# Patient Record
Sex: Female | Born: 1985
Health system: Southern US, Community
[De-identification: ages and names within clinical notes are randomized; demographics above are authoritative.]

## PROBLEM LIST (undated history)

## (undated) DIAGNOSIS — L309 Dermatitis, unspecified: Secondary | ICD-10-CM

## (undated) DIAGNOSIS — Z3490 Encounter for supervision of normal pregnancy, unspecified, unspecified trimester: Secondary | ICD-10-CM

## (undated) HISTORY — DX: Dermatitis, unspecified: L30.9

## (undated) HISTORY — PX: ADENOIDECTOMY: SUR15

## (undated) HISTORY — PX: TONSILLECTOMY: SUR1361

---

## 2005-01-31 ENCOUNTER — Other Ambulatory Visit: Admission: RE | Admit: 2005-01-31 | Discharge: 2005-01-31 | Payer: Self-pay | Admitting: Family Medicine

## 2005-03-12 ENCOUNTER — Emergency Department (HOSPITAL_COMMUNITY): Admission: EM | Admit: 2005-03-12 | Discharge: 2005-03-12 | Payer: Self-pay | Admitting: *Deleted

## 2007-01-09 ENCOUNTER — Emergency Department (HOSPITAL_COMMUNITY): Admission: EM | Admit: 2007-01-09 | Discharge: 2007-01-09 | Payer: Self-pay | Admitting: Emergency Medicine

## 2007-09-17 ENCOUNTER — Emergency Department (HOSPITAL_COMMUNITY): Admission: EM | Admit: 2007-09-17 | Discharge: 2007-09-17 | Payer: Self-pay | Admitting: Emergency Medicine

## 2008-12-07 ENCOUNTER — Emergency Department (HOSPITAL_COMMUNITY): Admission: EM | Admit: 2008-12-07 | Discharge: 2008-12-07 | Payer: Self-pay | Admitting: Emergency Medicine

## 2010-01-08 ENCOUNTER — Emergency Department (HOSPITAL_COMMUNITY): Admission: EM | Admit: 2010-01-08 | Discharge: 2010-01-09 | Payer: Self-pay | Admitting: Emergency Medicine

## 2010-10-31 LAB — BASIC METABOLIC PANEL
BUN: 11 mg/dL (ref 6–23)
CO2: 25 mEq/L (ref 19–32)
Chloride: 106 mEq/L (ref 96–112)
GFR calc non Af Amer: >60 mL/min (ref >60)
Potassium: 3.7 mEq/L (ref 3.5–5.1)

## 2010-10-31 LAB — DIFFERENTIAL
Basophils Absolute: 0 10*3/uL (ref 0.0–0.1)
Eosinophils Absolute: 0.2 10*3/uL (ref 0.0–0.7)
Eosinophils Relative: 2 % (ref 0–5)
Lymphocytes Relative: 35 % (ref 12–46)
Monocytes Absolute: 0.8 10*3/uL (ref 0.1–1.0)
Monocytes Relative: 11 % (ref 3–12)
Neutrophils Relative %: 51 % (ref 43–77)

## 2010-10-31 LAB — CBC
Hemoglobin: 13.4 g/dL (ref 12.0–15.0)
Platelets: 291 10*3/uL (ref 150–400)
WBC: 7.2 10*3/uL (ref 4.0–10.5)

## 2011-05-05 LAB — PREGNANCY, URINE: Preg Test, Ur: POSITIVE

## 2012-05-18 ENCOUNTER — Encounter (HOSPITAL_COMMUNITY): Payer: Self-pay

## 2012-05-18 ENCOUNTER — Emergency Department (HOSPITAL_COMMUNITY)
Admission: EM | Admit: 2012-05-18 | Discharge: 2012-05-18 | Disposition: A | Discharge status: Home / Self Care | Payer: BC Managed Care – PPO | Attending: Emergency Medicine | Admitting: Emergency Medicine

## 2012-05-18 DIAGNOSIS — Z3490 Encounter for supervision of normal pregnancy, unspecified, unspecified trimester: Secondary | ICD-10-CM

## 2012-05-18 DIAGNOSIS — R32 Unspecified urinary incontinence: Secondary | ICD-10-CM

## 2012-05-18 DIAGNOSIS — O210 Mild hyperemesis gravidarum: Secondary | ICD-10-CM | POA: Insufficient documentation

## 2012-05-18 DIAGNOSIS — Z9089 Acquired absence of other organs: Secondary | ICD-10-CM | POA: Insufficient documentation

## 2012-05-18 DIAGNOSIS — R1110 Vomiting, unspecified: Secondary | ICD-10-CM

## 2012-05-18 HISTORY — DX: Encounter for supervision of normal pregnancy, unspecified, unspecified trimester: Z34.90

## 2012-05-18 LAB — CBC WITH DIFFERENTIAL/PLATELET
Basophils Absolute: 0 10*3/uL (ref 0.0–0.1)
Eosinophils Absolute: 0.2 10*3/uL (ref 0.0–0.7)
Hemoglobin: 11.5 g/dL — ABNORMAL LOW (ref 12.0–15.0)
Lymphocytes Relative: 18 % (ref 12–46)
MCH: 27.2 pg (ref 26.0–34.0)
MCV: 79.4 fL (ref 78.0–100.0)
WBC: 8.4 10*3/uL (ref 4.0–10.5)

## 2012-05-18 LAB — COMPREHENSIVE METABOLIC PANEL
AST: 13 U/L (ref 0–37)
Albumin: 3.2 g/dL — ABNORMAL LOW (ref 3.5–5.2)
CO2: 22 mEq/L (ref 19–32)
Glucose, Bld: 88 mg/dL (ref 70–99)
Potassium: 3.2 mEq/L — ABNORMAL LOW (ref 3.5–5.1)
Sodium: 135 mEq/L (ref 135–145)

## 2012-05-18 LAB — URINALYSIS, ROUTINE W REFLEX MICROSCOPIC
Bilirubin Urine: NEGATIVE
Hgb urine dipstick: NEGATIVE
Ketones, ur: NEGATIVE mg/dL
Nitrite: NEGATIVE
Protein, ur: NEGATIVE mg/dL
Urobilinogen, UA: 1 mg/dL (ref 0.0–1.0)

## 2012-05-18 LAB — WET PREP, GENITAL

## 2012-05-18 MED ORDER — ONDANSETRON 8 MG PO TBDP: ORAL_TABLET | ORAL | Status: DC

## 2012-05-18 MED ORDER — SODIUM CHLORIDE 0.9 % IV BOLUS (SEPSIS)
1000.0000 mL | Freq: Once | INTRAVENOUS | Status: AC
  Administered 2012-05-18: 1000 mL via INTRAVENOUS

## 2012-05-18 MED ORDER — GUAIFENESIN-CODEINE 100-10 MG/5ML PO SYRP: 5.0000 mL | ORAL_SOLUTION | Freq: Three times a day (TID) | ORAL | Status: DC | PRN

## 2012-05-18 MED ORDER — ONDANSETRON HCL 4 MG/2ML IJ SOLN
4.0000 mg | Freq: Once | INTRAMUSCULAR | Status: AC
  Administered 2012-05-18: 4 mg via INTRAVENOUS
  Filled 2012-05-18: qty 2

## 2012-05-18 NOTE — ED Provider Notes (Signed)
History   This chart was scribed for Geoffery Lyons, MD scribed by Magnus Sinning. The patient was seen in room [facility]01/[facility]01 at 18:59   CSN: [id number]  Arrival date & time 05/18/12  1827   First MD Initiated Contact with Patient 05/18/12 1854      Chief Complaint  Patient presents with  . Emesis  . Weakness    (Consider location/radiation/quality/duration/timing/severity/associated sxs/prior treatment) HPI Shannon Moon is a 26 y.o.[redacted] weeks pregnant female who presents to the Emergency Department complaining of constant moderate emesis, onset two weeks with associated bladder incontinence, weakness cough and congestion. Patient states that she has been unable to keep anything she drinks or eats down. She says whenever she coughs or vomits that she would also urinate on herself. States she has taken otc decongestant with minimal relief, and denies contractions, vaginal bleeding , vaginal discharge, or diarrhea. This is the patient's second pregnancy and she reports child was seen recently for treatment of PNA. Otherwise does not provide any other sick contact exposure.  Past Medical History  Diagnosis Date  . Pregnancy     Past Surgical History  Procedure Date  . Cesarean section   . Tonsillectomy   . Adenoidectomy     No family history on file.  History  Substance Use Topics  . Smoking status: Former Games developer  . Smokeless tobacco: Not on file  . Alcohol Use: No    OB History    Grav Para Term Preterm Abortions TAB SAB Ect Mult Living   1               Review of Systems 10 Systems reviewed and are negative for acute change except as noted in the HPI. Allergies  Review of patient's allergies indicates no known allergies.  Home Medications  No current outpatient prescriptions on file.  BP 124/79  Pulse 119  Temp 98.7 F (37.1 C) (Oral)  Resp 20  Ht 5\' 4"  (1.626 m)  Wt 152 lb (68.947 kg)  BMI 26.09 kg/m2  SpO2 100%  LMP 12/02/2011  Physical Exam  Nursing  note and vitals reviewed. Constitutional: She is oriented to person, place, and time. She appears well-developed and well-nourished. No distress.  HENT:  Head: Normocephalic and atraumatic.  Eyes: Conjunctivae normal and EOM are normal.  Neck: Neck supple. No tracheal deviation present.  Cardiovascular: Normal rate.   Pulmonary/Chest: Effort normal. No respiratory distress.  Abdominal: She exhibits no distension. There is no tenderness. There is no rebound and no guarding.       Gravid uterus with fundal height consistent with gestational age  Musculoskeletal: Normal range of motion.  Neurological: She is alert and oriented to person, place, and time. No sensory deficit.  Skin: Skin is dry.  Psychiatric: She has a normal mood and affect. Her behavior is normal.    ED Course  Procedures (including critical care time) DIAGNOSTIC STUDIES: Oxygen Saturation is 100% on room air, normal by my interpretation.    COORDINATION OF CARE: 19:00: Physical exam performed  Labs Reviewed  CBC WITH DIFFERENTIAL - Abnormal; Notable for the following:    Hemoglobin 11.5 (*)     HCT 33.6 (*)     All other components within normal limits  COMPREHENSIVE METABOLIC PANEL - Abnormal; Notable for the following:    Potassium 3.2 (*)     Albumin 3.2 (*)     Total Bilirubin 0.2 (*)     All other components within normal limits  URINALYSIS, ROUTINE W  REFLEX MICROSCOPIC   No results found.   No diagnosis found.    MDM  The patient presents here with vomiting and nausea for the past two days.  When she vomits, she says she leaks urine.  She denies contractions or pain.  There is no vaginal discharge or spotting.  On exam, the abdomen is gravid consistent with stated gestational age.  The pelvic exam reveals a closed os with pH of 4.5.  This does not appear to be a amniotic fluid leak.  She was hydrated and given zofran and is feeling better.  She will be discharged to home, to return prn for any  problems.     I personally performed the services described in this documentation, which was scribed in my presence. The recorded information has been reviewed and considered.           Geoffery Lyons, MD 05/18/12 2037

## 2012-05-18 NOTE — ED Notes (Signed)
Pt reports being [redacted] weeks pregnant, due date Sep 05, 2012, 2nd pregnancy, no miscarriage or abortion.  Has been leaking urine today when coughing or vomiting. Denies any back pain, denies fever.

## 2012-05-18 NOTE — ED Notes (Signed)
Pt reports that she has been sick for 2 weeks, w/ cough and congestion, started vomiting today and unable to keep foods/liquids down. She is [redacted] weeks pregnant and is feeling weak.

## 2012-05-18 NOTE — ED Notes (Signed)
Pt discharged. Pt stable at time of discharge. Medications reviewed pt has no questions regarding discharge at this time. Pt voiced understanding of discharge instructions.  

## 2012-05-20 LAB — GC/CHLAMYDIA PROBE AMP, GENITAL
Chlamydia, DNA Probe: NEGATIVE
GC Probe Amp, Genital: NEGATIVE

## 2012-12-02 ENCOUNTER — Encounter: Payer: Self-pay | Admitting: Family Medicine

## 2012-12-02 ENCOUNTER — Ambulatory Visit (INDEPENDENT_AMBULATORY_CARE_PROVIDER_SITE_OTHER): Payer: BC Managed Care – PPO | Admitting: Family Medicine

## 2012-12-02 ENCOUNTER — Telehealth: Payer: Self-pay | Admitting: Nurse Practitioner

## 2012-12-02 ENCOUNTER — Encounter: Payer: Self-pay | Admitting: *Deleted

## 2012-12-02 VITALS — BP 121/71 | HR 90 | Temp 98.1°F | Wt 144.6 lb

## 2012-12-02 DIAGNOSIS — I998 Other disorder of circulatory system: Secondary | ICD-10-CM

## 2012-12-02 DIAGNOSIS — R58 Hemorrhage, not elsewhere classified: Secondary | ICD-10-CM

## 2012-12-02 DIAGNOSIS — R21 Rash and other nonspecific skin eruption: Secondary | ICD-10-CM

## 2012-12-02 LAB — POCT CBC
Granulocyte percent: 50.5 %G (ref 37–80)
HCT, POC: 38.5 % (ref 37.7–47.9)
Hemoglobin: 13.1 g/dL (ref 12.2–16.2)
Lymph, poc: 2.7 (ref 0.6–3.4)
MCH, POC: 25.8 pg — AB (ref 27–31.2)
MCHC: 34 g/dL (ref 31.8–35.4)
MCV: 76 fL — AB (ref 80–97)
MPV: 7.2 fL (ref 0–99.8)
POC Granulocyte: 3.2 (ref 2–6.9)
POC LYMPH PERCENT: 42 %L (ref 10–50)
Platelet Count, POC: 305 10*3/uL (ref 142–424)
RBC: 5.1 M/uL (ref 4.04–5.48)
RDW, POC: 14.3 %
WBC: 6.4 10*3/uL (ref 4.6–10.2)

## 2012-12-02 MED ORDER — PREDNISONE 20 MG PO TABS: 40.0000 mg | ORAL_TABLET | Freq: Every day | ORAL | Status: DC

## 2012-12-02 MED ORDER — CETIRIZINE HCL 10 MG PO TABS: 10.0000 mg | ORAL_TABLET | Freq: Every day | ORAL | Status: DC

## 2012-12-02 MED ORDER — TRIAMCINOLONE ACETONIDE 0.1 % EX CREA: TOPICAL_CREAM | Freq: Two times a day (BID) | CUTANEOUS | Status: DC

## 2012-12-02 NOTE — Progress Notes (Signed)
Patient ID: Shannon Moon, female   DOB: [birth year], 27 y.o.   MRN: [id number] SUBJECTIVE: HPI: Rash appeared 2 weeks and itching badly Rash Patient presents for evaluation of a rash involving the chest, elbow, forearm and upper body. Rash started 2 weeks ago. Lesions are skin colored, and raised in texture. Rash has not changed over time. Rash is pruritic. Associated symptoms: none. Patient denies: abdominal pain, arthralgia, congestion, cough, decrease in appetite, fever, irritability, myalgia, sore throat and vomiting. Patient has not had contacts with similar rash. Patient has not had new exposures (soaps, lotions, laundry detergents, foods, medications, plants, insects or animals).   PMH/PSH: reviewed/updated in Epic  SH/FH: reviewed/updated in Epic  Allergies: reviewed/updated in Epic  Medications: reviewed/updated in Epic  Immunizations: reviewed/updated in Epic  ROS: As above in the HPI. All other systems are stable or negative.  OBJECTIVE: APPEARANCE:  Patient in no acute distress.The patient appeared well nourished and normally developed. Acyanotic. Waist: VITAL SIGNS:BP 121/71  Pulse 90  Temp(Src) 98.1 F (36.7 C) (Oral)  Wt 144 lb 9.6 oz (65.59 kg)  BMI 24.81 kg/m2  LMP 11/01/2011  Breastfeeding? Unknown Not breast feeding her 83 month old.  SKIN: warm and  Dry without tattoos and scars. There is a widespread fine papular rash on the trunk and forearms and arms. Very pruritic. Has eccymotic bruises, from scratching.  HEAD and Neck: without JVD, Head and scalp: normal Eyes:No scleral icterus. Fundi normal, eye movements normal. Ears: Auricle normal, canal normal, Tympanic membranes normal, insufflation normal. Nose: normal Throat: normal Neck & thyroid: normal  CHEST & LUNGS: Chest wall: normal Lungs: Clear  CVS: Reveals the PMI to be normally located. Regular rhythm, First and Second Heart sounds are normal,  absence of murmurs, rubs or  gallops. Peripheral vasculature: Radial pulses: normal Dorsal pedis pulses: normal Posterior pulses: normal  ABDOMEN:  Appearance: normal Benign,, no organomegaly, no masses, no Abdominal Aortic enlargement. No Guarding , no rebound. No Bruits. Bowel sounds: normal  RECTAL: N/A GU: N/A  EXTREMETIES: nonedematous.  MUSCULOSKELETAL:  Spine: normal Joints: intact  NEUROLOGIC: oriented to time,place and person; nonfocal. Strength is normal  ASSESSMENT: Rash and nonspecific skin eruption - Plan: POCT CBC  Ecchymosis will check a CBC to ensure this is not due to ITP.  PLAN: Orders Placed This Encounter  Procedures  . POCT CBC   Results for orders placed in visit on 12/02/12 (from the past 24 hour(s))  POCT CBC     Status: Abnormal   Collection Time    12/02/12  6:32 PM      Result Value Range   WBC 6.4  4.6 - 10.2 K/uL   Lymph, poc 2.7  0.6 - 3.4   POC LYMPH PERCENT 42.0  10 - 50 %L   POC Granulocyte 3.2  2 - 6.9   Granulocyte percent 50.5  37 - 80 %G   RBC 5.1  4.04 - 5.48 M/uL   Hemoglobin 13.1  12.2 - 16.2 g/dL   HCT, POC 40.9  81.1 - 47.9 %   MCV 76.0 (*) 80 - 97 fL   MCH, POC 25.8 (*) 27 - 31.2 pg   MCHC 34.0  31.8 - 35.4 g/dL   RDW, POC 91.4     Platelet Count, POC 305.0  142 - 424 K/uL   MPV 7.2  0 - 99.8 fL   Meds ordered this encounter  Medications  . predniSONE (DELTASONE) 20 MG tablet    Sig: Take 2 tablets (  40 mg total) by mouth daily. For 2 days then 1 tablet daily for 2 days then 1/2 tab daily for 2 days.    Dispense:  7 tablet    Refill:  0  . cetirizine (ZYRTEC) 10 MG tablet    Sig: Take 1 tablet (10 mg total) by mouth daily.    Dispense:  30 tablet    Refill:  5  . triamcinolone cream (KENALOG) 0.1 %    Sig: Apply topically 2 (two) times daily. For 10 days.    Dispense:  30 g    Refill:  0  discussed results, and skin care. RTC prn.  Shamari Lofquist P. Modesto Charon, M.D.

## 2012-12-02 NOTE — Telephone Encounter (Signed)
Patient c/o diffuse pruritic rash and bruising x 2 weeks.  She has taken Benadryl with no relief.  Appt scheduled in [facility] for this evening.

## 2012-12-24 ENCOUNTER — Telehealth: Payer: Self-pay | Admitting: Nurse Practitioner

## 2012-12-24 NOTE — Telephone Encounter (Signed)
OFFERED APPT FOR TODAY. PT DECIDED TO WAIT AND CALL BACK LATER IF WANTED TO BE SEEN.

## 2013-05-29 ENCOUNTER — Ambulatory Visit: Payer: BC Managed Care – PPO | Admitting: Family Medicine

## 2014-06-15 ENCOUNTER — Encounter: Payer: Self-pay | Admitting: Family Medicine

## 2014-09-18 ENCOUNTER — Encounter: Payer: Self-pay | Admitting: Family Medicine

## 2014-09-18 ENCOUNTER — Ambulatory Visit (INDEPENDENT_AMBULATORY_CARE_PROVIDER_SITE_OTHER): Payer: BLUE CROSS/BLUE SHIELD | Admitting: Family Medicine

## 2014-09-18 VITALS — BP 131/85 | HR 89 | Temp 98.6°F | Ht 64.0 in | Wt 172.2 lb

## 2014-09-18 DIAGNOSIS — G43001 Migraine without aura, not intractable, with status migrainosus: Secondary | ICD-10-CM

## 2014-09-18 DIAGNOSIS — J069 Acute upper respiratory infection, unspecified: Secondary | ICD-10-CM

## 2014-09-18 MED ORDER — AZITHROMYCIN 250 MG PO TABS: ORAL_TABLET | ORAL | Status: DC

## 2014-09-18 MED ORDER — METHYLPREDNISOLONE (PAK) 4 MG PO TABS: ORAL_TABLET | ORAL | Status: DC

## 2014-09-18 MED ORDER — SUMATRIPTAN SUCCINATE 100 MG PO TABS: 100.0000 mg | ORAL_TABLET | ORAL | Status: DC | PRN

## 2014-09-18 MED ORDER — BUTALBITAL-APAP-CAFFEINE 50-325-40 MG PO TABS: 1.0000 | ORAL_TABLET | Freq: Four times a day (QID) | ORAL | Status: DC | PRN

## 2014-09-18 NOTE — Progress Notes (Signed)
   Subjective:    Patient ID: Shannon Moon, female    DOB: 10/04/1985, 29 y.o.   MRN: [id number]  HPI Patient is c/o headache with migraine features.  She denies aura and has one sided pulsatile headache with phonophotophobia.  She is having some URI sx's.  Review of Systems  Constitutional: Negative for fever.  HENT: Negative for ear pain.   Eyes: Negative for discharge.  Respiratory: Negative for cough.   Cardiovascular: Negative for chest pain.  Gastrointestinal: Negative for abdominal distention.  Endocrine: Negative for polyuria.  Genitourinary: Negative for difficulty urinating.  Musculoskeletal: Negative for gait problem and neck pain.  Skin: Negative for color change and rash.  Neurological: Negative for speech difficulty and headaches.  Psychiatric/Behavioral: Negative for agitation.       Objective:    BP 131/85 mmHg  Pulse 89  Temp(Src) 98.6 F (37 C) (Oral)  Ht 5\' 4"  (1.626 m)  Wt 172 lb 3.2 oz (78.109 kg)  BMI 29.54 kg/m2 Physical Exam  Constitutional: She is oriented to person, place, and time. She appears well-developed and well-nourished.  HENT:  Head: Normocephalic and atraumatic.  Mouth/Throat: Oropharynx is clear and moist.  Eyes: Pupils are equal, round, and reactive to light.  Neck: Normal range of motion. Neck supple.  Cardiovascular: Normal rate and regular rhythm.   No murmur heard. Pulmonary/Chest: Effort normal and breath sounds normal.  Abdominal: Soft. Bowel sounds are normal. There is no tenderness.  Neurological: She is alert and oriented to person, place, and time.  Skin: Skin is warm and dry.  Psychiatric: She has a normal mood and affect.          Assessment & Plan:     ICD-9-CM ICD-10-CM   1. Migraine without aura and with status migrainosus, not intractable 346.12 G43.001 methylPREDNIsolone (MEDROL DOSPACK) 4 MG tablet     SUMAtriptan (IMITREX) 100 MG tablet     butalbital-acetaminophen-caffeine (FIORICET) 50-325-40 MG per  tablet  2. URI (upper respiratory infection) 465.9 J06.9 azithromycin (ZITHROMAX) 250 MG tablet     No Follow-up on file.  Deatra CanterWilliam J Bj Morlock FNP

## 2014-11-27 ENCOUNTER — Ambulatory Visit (INDEPENDENT_AMBULATORY_CARE_PROVIDER_SITE_OTHER): Payer: BLUE CROSS/BLUE SHIELD

## 2014-11-27 ENCOUNTER — Ambulatory Visit (INDEPENDENT_AMBULATORY_CARE_PROVIDER_SITE_OTHER): Payer: BLUE CROSS/BLUE SHIELD | Admitting: Family Medicine

## 2014-11-27 ENCOUNTER — Other Ambulatory Visit: Payer: Self-pay | Admitting: Family Medicine

## 2014-11-27 ENCOUNTER — Encounter: Payer: Self-pay | Admitting: Family Medicine

## 2014-11-27 VITALS — BP 132/87 | HR 92 | Temp 99.1°F | Ht 64.0 in | Wt 176.0 lb

## 2014-11-27 DIAGNOSIS — M25571 Pain in right ankle and joints of right foot: Secondary | ICD-10-CM | POA: Diagnosis not present

## 2014-11-27 DIAGNOSIS — S93601A Unspecified sprain of right foot, initial encounter: Secondary | ICD-10-CM | POA: Diagnosis not present

## 2014-11-27 MED ORDER — DICLOFENAC SODIUM 75 MG PO TBEC: 75.0000 mg | DELAYED_RELEASE_TABLET | Freq: Two times a day (BID) | ORAL | Status: DC

## 2014-11-27 NOTE — Progress Notes (Addendum)
Subjective:  Patient ID: Shannon Moon, female    DOB: 17-Jul-1986  Age: 29 y.o. MRN: [id number]  CC: Ankle Pain   HPI Shannon Moon presents for walking down steps this morning. She stepped off and missed the bottom step, stumbled and inverted her right foot. Now has pain and swelling at the lateral aspect of the midfoot.  History Shannon Moon has a past medical history of Pregnancy.   She has past surgical history that includes Cesarean section; Tonsillectomy; and Adenoidectomy.   Her family history is not on file.She reports that she has never smoked. She does not have any smokeless tobacco history on file. She reports that she does not drink alcohol or use illicit drugs.  Current Outpatient Prescriptions on File Prior to Visit  Medication Sig Dispense Refill  . butalbital-acetaminophen-caffeine (FIORICET) 50-325-40 MG per tablet Take 1-2 tablets by mouth every 6 (six) hours as needed for headache. 20 tablet 3  . cetirizine (ZYRTEC) 10 MG tablet Take 1 tablet (10 mg total) by mouth daily. 30 tablet 5  . SUMAtriptan (IMITREX) 100 MG tablet Take 1 tablet (100 mg total) by mouth every 2 (two) hours as needed for migraine or headache. May repeat in 2 hours if headache persists or recurs. 10 tablet 3   No current facility-administered medications on file prior to visit.    ROS Review of Systems  Constitutional: Negative for fever, chills, diaphoresis, appetite change, fatigue and unexpected weight change.  HENT: Negative for congestion, rhinorrhea and sneezing.   Eyes: Negative for pain.  Respiratory: Negative for cough, chest tightness and shortness of breath.   Cardiovascular: Negative for chest pain and palpitations.  Gastrointestinal: Negative for vomiting.  Musculoskeletal: Positive for joint swelling and arthralgias.       See history of present illness  Neurological: Negative for dizziness, weakness and numbness.    Objective:  BP 132/87 mmHg  Pulse 92  Temp(Src) 99.1 F  (37.3 C)  Ht  (1.626 m)  Wt 176 lb (79.833 kg)  BMI 30.20 kg/m2  Physical Exam  Constitutional: She is oriented to person, place, and time. She appears well-developed and well-nourished. No distress.  HENT:  Head: Normocephalic and atraumatic.  Right Ear: External ear normal.  Left Ear: External ear normal.  Nose: Nose normal.  Mouth/Throat: Oropharynx is clear and moist.  Eyes: Conjunctivae and EOM are normal. Pupils are equal, round, and reactive to light.  Neck: Normal range of motion. Neck supple. No thyromegaly present.  Cardiovascular: Normal rate, regular rhythm and normal heart sounds.   No murmur heard. Pulmonary/Chest: Effort normal and breath sounds normal. No respiratory distress. She has no wheezes. She has no rales.  Abdominal: Soft. Bowel sounds are normal. She exhibits no distension. There is no tenderness.  Musculoskeletal: She exhibits tenderness (at lateral right mid foot. Moderate. Stable for porcine and varus valgus motion).  Lymphadenopathy:    She has no cervical adenopathy.  Neurological: She is alert and oriented to person, place, and time. She has normal reflexes.  Skin: Skin is warm and dry.  Psychiatric: She has a normal mood and affect. Her behavior is normal. Judgment and thought content normal.    Assessment & Plan:   Shannon Moon was seen today for ankle pain.  Diagnoses and all orders for this visit:  Pain in joint, ankle and foot, right Orders: -     Cancel: DG Ankle Complete Right; Future  Foot sprain, right, initial encounter  Other orders -  diclofenac (VOLTAREN) 75 MG EC tablet; Take 1 tablet (75 mg total) by mouth 2 (two) times daily.   I have discontinued Shannon Moon's azithromycin and methylPREDNIsolone. I am also having her start on diclofenac. Additionally, I am having her maintain her cetirizine, SUMAtriptan, and butalbital-acetaminophen-caffeine.  Meds ordered this encounter  Medications  . diclofenac (VOLTAREN) 75 MG EC  tablet    Sig: Take 1 tablet (75 mg total) by mouth 2 (two) times daily.    Dispense:  60 tablet    Refill:  2   X-ray of the right foot shows no evidence of fracture. There is some soft tissue swelling at the lateral midfoot.  Follow-up: Return in about 2 weeks (around 12/11/2014).  Mechele ClaudeWarren Lavene Penagos, M.D.

## 2014-12-08 ENCOUNTER — Telehealth: Payer: Self-pay

## 2014-12-08 MED ORDER — MELOXICAM 15 MG PO TABS: 15.0000 mg | ORAL_TABLET | Freq: Every day | ORAL | Status: DC

## 2014-12-08 NOTE — Telephone Encounter (Signed)
Insurance denied prior authorization for Diclofenac Sodium  Only approved for osteoarthritis

## 2014-12-08 NOTE — Telephone Encounter (Signed)
Meloxicam ordered

## 2014-12-08 NOTE — Telephone Encounter (Signed)
I'm not sure what to order since the chart says she has no coverage for visit. Diclofenac is one of the cheapest things available if she is going to pay cash. Is it possible she is on Medicaid? If so is there a way to find out the options that they do cover? Thanks, WS.

## 2014-12-09 NOTE — Telephone Encounter (Signed)
Detailed message left for patient.

## 2015-05-24 ENCOUNTER — Ambulatory Visit (INDEPENDENT_AMBULATORY_CARE_PROVIDER_SITE_OTHER): Payer: BLUE CROSS/BLUE SHIELD | Admitting: Family Medicine

## 2015-05-24 VITALS — BP 135/81 | HR 99 | Temp 98.3°F | Ht 64.0 in | Wt 178.6 lb

## 2015-05-24 DIAGNOSIS — J329 Chronic sinusitis, unspecified: Secondary | ICD-10-CM | POA: Diagnosis not present

## 2015-05-24 DIAGNOSIS — J40 Bronchitis, not specified as acute or chronic: Secondary | ICD-10-CM | POA: Diagnosis not present

## 2015-05-24 MED ORDER — PSEUDOEPHEDRINE-GUAIFENESIN ER 120-1200 MG PO TB12: 1.0000 | ORAL_TABLET | Freq: Two times a day (BID) | ORAL | Status: DC

## 2015-05-24 MED ORDER — LEVOFLOXACIN 500 MG PO TABS: 500.0000 mg | ORAL_TABLET | Freq: Every day | ORAL | Status: DC

## 2015-05-24 MED ORDER — BENZONATATE 200 MG PO CAPS: 200.0000 mg | ORAL_CAPSULE | Freq: Three times a day (TID) | ORAL | Status: DC | PRN

## 2015-05-24 NOTE — Progress Notes (Signed)
Subjective:  Patient ID: Shannon Moon, female    DOB: 12-May-1986  Age: 29 y.o. MRN: [id number]  CC: Cough   HPI  [name] presents for respiratory congestion and cough productive of yellow sputum. Denies fever, dyspnea. Chest feels tight centrally.    History Shannon Moon has a past medical history of Pregnancy.   She has past surgical history that includes Cesarean section; Tonsillectomy; and Adenoidectomy.   Her family history is not on file.She reports that she has never smoked. She does not have any smokeless tobacco history on file. She reports that she does not drink alcohol or use illicit drugs.  Outpatient Prescriptions Prior to Visit  Medication Sig Dispense Refill  . butalbital-acetaminophen-caffeine (FIORICET) 50-325-40 MG per tablet Take 1-2 tablets by mouth every 6 (six) hours as needed for headache. (Patient not taking: Reported on 05/24/2015) 20 tablet 3  . cetirizine (ZYRTEC) 10 MG tablet Take 1 tablet (10 mg total) by mouth daily. (Patient not taking: Reported on 05/24/2015) 30 tablet 5  . meloxicam (MOBIC) 15 MG tablet Take 1 tablet (15 mg total) by mouth daily. (Patient not taking: Reported on 05/24/2015) 30 tablet 2  . SUMAtriptan (IMITREX) 100 MG tablet Take 1 tablet (100 mg total) by mouth every 2 (two) hours as needed for migraine or headache. May repeat in 2 hours if headache persists or recurs. (Patient not taking: Reported on 05/24/2015) 10 tablet 3   No facility-administered medications prior to visit.    ROS Review of Systems  Constitutional: Negative for fever, chills, activity change and appetite change.  HENT: Positive for congestion, postnasal drip, rhinorrhea and sinus pressure. Negative for ear discharge, ear pain, hearing loss, nosebleeds, sneezing and trouble swallowing.   Respiratory: Positive for cough. Negative for chest tightness and shortness of breath.   Cardiovascular: Negative for chest pain and palpitations.  Skin: Negative for rash.     Objective:  BP 135/81 mmHg  Pulse 99  Temp(Src) 98.3 F (36.8 C) (Oral)  Ht  (1.626 m)  Wt 178 lb 9.6 oz (81.012 kg)  BMI 30.64 kg/m2  BP Readings from Last 3 Encounters:  05/24/15 135/81  11/27/14 132/87  09/18/14 131/85    Wt Readings from Last 3 Encounters:  05/24/15 178 lb 9.6 oz (81.012 kg)  11/27/14 176 lb (79.833 kg)  09/18/14 172 lb 3.2 oz (78.109 kg)     Physical Exam  Constitutional: She is oriented to person, place, and time. She appears well-developed and well-nourished. No distress.  HENT:  Head: Normocephalic and atraumatic.  Right Ear: External ear normal.  Left Ear: External ear normal.  Nose: Mucosal edema, rhinorrhea and sinus tenderness present. No epistaxis. Right sinus exhibits maxillary sinus tenderness. Left sinus exhibits maxillary sinus tenderness.  Mouth/Throat: Oropharynx is clear and moist.  Eyes: Conjunctivae and EOM are normal. Pupils are equal, round, and reactive to light.  Neck: Normal range of motion. Neck supple.  Cardiovascular: Normal rate, regular rhythm and normal heart sounds.   No murmur heard. Pulmonary/Chest: Effort normal. No respiratory distress. She has wheezes (mild bronchovesicular changes). She has no rales.  Lymphadenopathy:    She has no cervical adenopathy.  Neurological: She is alert and oriented to person, place, and time. She has normal reflexes.  Skin: Skin is warm and dry.  Psychiatric: She has a normal mood and affect. Her behavior is normal.    No results found for: HGBA1C  Lab Results  Component Value Date   WBC 6.4 12/02/2012  HGB 13.1 12/02/2012   HCT 38.5 12/02/2012   PLT 311 05/18/2012   GLUCOSE 88 05/18/2012   ALT 7 05/18/2012   AST 13 05/18/2012   NA 135 05/18/2012   K 3.2* 05/18/2012   CL 103 05/18/2012   CREATININE 0.50 05/18/2012   BUN 7 05/18/2012   CO2 22 05/18/2012    No results found.  Assessment & Plan:   Shannon Moon was seen today for cough.  Diagnoses and all orders  for this visit:  Sinobronchitis  Other orders -     levofloxacin (LEVAQUIN) 500 MG tablet; Take 1 tablet (500 mg total) by mouth daily. -     benzonatate (TESSALON) 200 MG capsule; Take 1 capsule (200 mg total) by mouth 3 (three) times daily as needed for cough. -     Pseudoephedrine-Guaifenesin [phone] MG TB12; Take 1 tablet by mouth 2 (two) times daily. For congeestion   I have discontinued Ms. Vernon's cetirizine, SUMAtriptan, butalbital-acetaminophen-caffeine, and meloxicam. I am also having her start on levofloxacin, benzonatate, and Pseudoephedrine-Guaifenesin.  Meds ordered this encounter  Medications  . levofloxacin (LEVAQUIN) 500 MG tablet    Sig: Take 1 tablet (500 mg total) by mouth daily.    Dispense:  7 tablet    Refill:  0  . benzonatate (TESSALON) 200 MG capsule    Sig: Take 1 capsule (200 mg total) by mouth 3 (three) times daily as needed for cough.    Dispense:  20 capsule    Refill:  0  . Pseudoephedrine-Guaifenesin [phone] MG TB12    Sig: Take 1 tablet by mouth 2 (two) times daily. For congeestion    Dispense:  20 each    Refill:  0     Follow-up: No Follow-up on file.  Shannon Moon, M.D.

## 2015-06-24 ENCOUNTER — Encounter: Payer: Self-pay | Admitting: Nurse Practitioner

## 2015-06-24 ENCOUNTER — Ambulatory Visit (INDEPENDENT_AMBULATORY_CARE_PROVIDER_SITE_OTHER): Payer: BLUE CROSS/BLUE SHIELD | Admitting: Nurse Practitioner

## 2015-06-24 VITALS — BP 130/81 | HR 94 | Temp 99.1°F | Ht 62.0 in | Wt 178.0 lb

## 2015-06-24 DIAGNOSIS — J069 Acute upper respiratory infection, unspecified: Secondary | ICD-10-CM | POA: Diagnosis not present

## 2015-06-24 DIAGNOSIS — J0101 Acute recurrent maxillary sinusitis: Secondary | ICD-10-CM | POA: Diagnosis not present

## 2015-06-24 DIAGNOSIS — L500 Allergic urticaria: Secondary | ICD-10-CM

## 2015-06-24 MED ORDER — AMOXICILLIN-POT CLAVULANATE 875-125 MG PO TABS: 1.0000 | ORAL_TABLET | Freq: Two times a day (BID) | ORAL | Status: DC

## 2015-06-24 MED ORDER — METHYLPREDNISOLONE ACETATE 80 MG/ML IJ SUSP
80.0000 mg | Freq: Once | INTRAMUSCULAR | Status: AC
  Administered 2015-06-24: 80 mg via INTRAMUSCULAR

## 2015-06-24 NOTE — Patient Instructions (Signed)

## 2015-06-24 NOTE — Progress Notes (Signed)
  Subjective:     Lavina HammanDevona Y Vernon is a 29 y.o. female who presents for evaluation of sinus pain. Symptoms include: congestion, cough, facial pain, fevers, post nasal drip and sore throat. Onset of symptoms was 5 days ago. Symptoms have been gradually worsening since that time. Past history is significant for no history of pneumonia or bronchitis. Patient is a non-smoker. * broke out in rash yesterday morning-0 think it may be from theraflu she started 2 days ago. The following portions of the patient's history were reviewed and updated as appropriate: allergies, current medications, past family history, past medical history, past social history, past surgical history and problem list.  Review of Systems Pertinent items are noted in HPI.   Objective:    BP 130/81 mmHg  Pulse 94  Temp(Src) 99.1 F (37.3 C) (Oral)  Ht 5\' 2"  (1.575 m)  Wt 178 lb (80.74 kg)  BMI 32.55 kg/m2 General appearance: alert and cooperative Eyes: conjunctivae/corneas clear. PERRL, EOM's intact. Fundi benign. Ears: normal TM's and external ear canals both ears Nose: clear discharge, mild congestion, turbinates pale, no sinus tenderness Throat: lips, mucosa, and tongue normal; teeth and gums normal Neck: no adenopathy, no carotid bruit, no JVD, supple, symmetrical, trachea midline and thyroid not enlarged, symmetric, no tenderness/mass/nodules Lungs: clear to auscultation bilaterally and deep dry cough Heart: regular rate and rhythm, S1, S2 normal, no murmur, click, rub or gallop Skin: macular - scattered, face, neck, abdomen, trunk    Assessment:    Acute bacterial sinusitis with cough.    Plan:   1. Take meds as prescribed 2. Use a cool mist humidifier especially during the winter months and when heat has been humid. 3. Use saline nose sprays frequently 4. Saline irrigations of the nose can be very helpful if done frequently.  * 4X daily for 1 week*  * Use of a nettie pot can be helpful with this. Follow  directions with this* 5. Drink plenty of fluids 6. Keep thermostat turn down low 7.For any cough or congestion  Use plain Mucinex- regular strength or max strength is fine   * Children- consult with Pharmacist for dosing 8. For fever or aces or pains- take tylenol or ibuprofen appropriate for age and weight.  * for fevers greater than 101 orally you may alternate ibuprofen and tylenol every  3 hours.    Meds ordered this encounter  Medications  . amoxicillin-clavulanate (AUGMENTIN) 875-125 MG tablet    Sig: Take 1 tablet by mouth 2 (two) times daily.    Dispense:  20 tablet    Refill:  0    Order Specific Question:  Supervising Provider    Answer:  Ernestina PennaMOORE, DONALD W [[id number]]  . methylPREDNISolone acetate (DEPO-MEDROL) injection 80 mg    Sig:    Mary-Margaret Daphine DeutscherMartin, FNP

## 2016-01-30 DIAGNOSIS — M2241 Chondromalacia patellae, right knee: Secondary | ICD-10-CM | POA: Diagnosis not present

## 2016-02-09 DIAGNOSIS — Z13220 Encounter for screening for lipoid disorders: Secondary | ICD-10-CM | POA: Diagnosis not present

## 2016-02-09 DIAGNOSIS — Z6831 Body mass index (BMI) 31.0-31.9, adult: Secondary | ICD-10-CM | POA: Diagnosis not present

## 2016-02-09 DIAGNOSIS — Z01419 Encounter for gynecological examination (general) (routine) without abnormal findings: Secondary | ICD-10-CM | POA: Diagnosis not present

## 2016-02-09 DIAGNOSIS — Z975 Presence of (intrauterine) contraceptive device: Secondary | ICD-10-CM | POA: Diagnosis not present

## 2016-02-09 DIAGNOSIS — Z131 Encounter for screening for diabetes mellitus: Secondary | ICD-10-CM | POA: Diagnosis not present

## 2016-02-09 DIAGNOSIS — Z1329 Encounter for screening for other suspected endocrine disorder: Secondary | ICD-10-CM | POA: Diagnosis not present

## 2016-03-08 DIAGNOSIS — Z3202 Encounter for pregnancy test, result negative: Secondary | ICD-10-CM | POA: Diagnosis not present

## 2016-03-08 DIAGNOSIS — Z3046 Encounter for surveillance of implantable subdermal contraceptive: Secondary | ICD-10-CM | POA: Diagnosis not present

## 2016-03-20 DIAGNOSIS — Z30017 Encounter for initial prescription of implantable subdermal contraceptive: Secondary | ICD-10-CM | POA: Diagnosis not present

## 2016-03-20 DIAGNOSIS — Z3202 Encounter for pregnancy test, result negative: Secondary | ICD-10-CM | POA: Diagnosis not present

## 2016-06-26 DIAGNOSIS — Z6832 Body mass index (BMI) 32.0-32.9, adult: Secondary | ICD-10-CM | POA: Diagnosis not present

## 2016-06-26 DIAGNOSIS — M79601 Pain in right arm: Secondary | ICD-10-CM | POA: Diagnosis not present

## 2016-07-25 DIAGNOSIS — M79601 Pain in right arm: Secondary | ICD-10-CM | POA: Diagnosis not present

## 2016-07-27 DIAGNOSIS — M79601 Pain in right arm: Secondary | ICD-10-CM | POA: Diagnosis not present

## 2016-08-01 DIAGNOSIS — M79601 Pain in right arm: Secondary | ICD-10-CM | POA: Diagnosis not present

## 2016-08-04 DIAGNOSIS — M79601 Pain in right arm: Secondary | ICD-10-CM | POA: Diagnosis not present

## 2016-08-09 DIAGNOSIS — M79601 Pain in right arm: Secondary | ICD-10-CM | POA: Diagnosis not present

## 2016-08-11 DIAGNOSIS — M79601 Pain in right arm: Secondary | ICD-10-CM | POA: Diagnosis not present

## 2016-08-17 DIAGNOSIS — M79601 Pain in right arm: Secondary | ICD-10-CM | POA: Diagnosis not present

## 2016-08-18 DIAGNOSIS — M79601 Pain in right arm: Secondary | ICD-10-CM | POA: Diagnosis not present

## 2016-08-22 DIAGNOSIS — M79601 Pain in right arm: Secondary | ICD-10-CM | POA: Diagnosis not present

## 2016-08-24 DIAGNOSIS — M79601 Pain in right arm: Secondary | ICD-10-CM | POA: Diagnosis not present

## 2016-08-28 DIAGNOSIS — M79601 Pain in right arm: Secondary | ICD-10-CM | POA: Diagnosis not present

## 2016-09-05 DIAGNOSIS — M79601 Pain in right arm: Secondary | ICD-10-CM | POA: Diagnosis not present

## 2016-09-12 DIAGNOSIS — M79601 Pain in right arm: Secondary | ICD-10-CM | POA: Diagnosis not present

## 2016-10-30 ENCOUNTER — Ambulatory Visit (INDEPENDENT_AMBULATORY_CARE_PROVIDER_SITE_OTHER): Payer: BLUE CROSS/BLUE SHIELD | Admitting: Family Medicine

## 2016-10-30 ENCOUNTER — Encounter: Payer: Self-pay | Admitting: Family Medicine

## 2016-10-30 VITALS — BP 108/70 | HR 102 | Temp 98.2°F | Ht 62.0 in | Wt 174.0 lb

## 2016-10-30 DIAGNOSIS — J0100 Acute maxillary sinusitis, unspecified: Secondary | ICD-10-CM | POA: Diagnosis not present

## 2016-10-30 MED ORDER — AMOXICILLIN-POT CLAVULANATE 875-125 MG PO TABS: 1.0000 | ORAL_TABLET | Freq: Two times a day (BID) | ORAL | 0 refills | Status: DC

## 2016-10-30 MED ORDER — PSEUDOEPHEDRINE-GUAIFENESIN ER 120-1200 MG PO TB12: 1.0000 | ORAL_TABLET | Freq: Two times a day (BID) | ORAL | 0 refills | Status: DC

## 2016-10-30 MED ORDER — BETAMETHASONE SOD PHOS & ACET 6 (3-3) MG/ML IJ SUSP
6.0000 mg | Freq: Once | INTRAMUSCULAR | Status: AC
  Administered 2016-10-30: 6 mg via INTRAMUSCULAR

## 2016-10-30 NOTE — Progress Notes (Signed)
Subjective:  Patient ID: Shanda Howells, female    DOB: 03/24/1986  Age: 31 y.o. MRN: [id number]  CC: Cough (pt here today c/o cough, nasal congestion and ear fullness.)   HPI YANELLY CANTRELLE presents for Patient presents with upper respiratory congestion. Rhinorrhea that is frequently purulent, thick & yellow. There is moderate sore throat. Patient reports coughing frequently as well.  Thick yellow  sputum noted. There is subjective fever, with chills & sweats. The patient denies being short of breath. Onset was 7 days ago with mild cough only . Gradually worsening. Tried OTCs without improvement.    History Taniqua has a past medical history of Pregnancy.   She has a past surgical history that includes Cesarean section; Tonsillectomy; and Adenoidectomy.   Her family history is not on file.She reports that she has never smoked. She has never used smokeless tobacco. She reports that she does not drink alcohol or use drugs.    ROS Review of Systems  Constitutional: Positive for appetite change, chills and fever.  HENT: Positive for congestion and rhinorrhea. Negative for ear pain, nosebleeds, postnasal drip, sinus pressure and sore throat.   Respiratory: Positive for cough. Negative for chest tightness and shortness of breath.   Cardiovascular: Negative for chest pain.  Musculoskeletal: Negative for myalgias.  Skin: Negative for rash.  Neurological: Negative for headaches.    Objective:  BP 108/70   Pulse (!) 102   Temp 98.2 F (36.8 C) (Oral)   Ht 5\' 2"  (1.575 m)   Wt 174 lb (78.9 kg)   BMI 31.83 kg/m   BP Readings from Last 3 Encounters:  10/30/16 108/70  06/24/15 130/81  05/24/15 135/81    Wt Readings from Last 3 Encounters:  10/30/16 174 lb (78.9 kg)  06/24/15 178 lb (80.7 kg)  05/24/15 178 lb 9.6 oz (81 kg)     Physical Exam  Constitutional: She is oriented to person, place, and time. She appears well-developed and well-nourished. No distress.  HENT:    Head: Normocephalic and atraumatic.  Right Ear: Tympanic membrane and external ear normal. No decreased hearing is noted.  Left Ear: Tympanic membrane and external ear normal. No decreased hearing is noted.  Nose: Mucosal edema (with purulent matter) present. Right sinus exhibits no frontal sinus tenderness. Left sinus exhibits no frontal sinus tenderness.  Mouth/Throat: Oropharyngeal exudate, posterior oropharyngeal edema and posterior oropharyngeal erythema present.  Neck: No Brudzinski's sign noted.  Cardiovascular: Normal rate and regular rhythm.   Pulmonary/Chest: Breath sounds normal. No respiratory distress.  Lymphadenopathy:       Head (right side): No preauricular adenopathy present.       Head (left side): No preauricular adenopathy present.       Right cervical: No superficial cervical adenopathy present.      Left cervical: No superficial cervical adenopathy present.  Neurological: She is alert and oriented to person, place, and time.  Skin: Skin is warm and dry.  Psychiatric: She has a normal mood and affect.      Assessment & Plan:   Anber was seen today for cough.  Diagnoses and all orders for this visit:  Acute maxillary sinusitis, recurrence not specified -     betamethasone acetate-betamethasone sodium phosphate (CELESTONE) injection 6 mg; Inject 1 mL (6 mg total) into the muscle once.  Other orders -     amoxicillin-clavulanate (AUGMENTIN) 875-125 MG tablet; Take 1 tablet by mouth 2 (two) times daily. Take all of this medication -  Pseudoephedrine-Guaifenesin [phone] MG TB12; Take 1 tablet by mouth 2 (two) times daily. For congestion       I have discontinued Ms. Wandler's levofloxacin, benzonatate, Pseudoephedrine-Guaifenesin, and amoxicillin-clavulanate. I am also having her start on amoxicillin-clavulanate and Pseudoephedrine-Guaifenesin. We will continue to administer betamethasone acetate-betamethasone sodium phosphate.  Allergies as of 10/30/2016    No Known Allergies     Medication List       Accurate as of 10/30/16  3:43 PM. Always use your most recent med list.          amoxicillin-clavulanate 875-125 MG tablet Commonly known as:  AUGMENTIN Take 1 tablet by mouth 2 (two) times daily. Take all of this medication   Pseudoephedrine-Guaifenesin [phone] MG Tb12 Take 1 tablet by mouth 2 (two) times daily. For congestion        Follow-up: Return if symptoms worsen or fail to improve.  Mechele ClaudeWarren Sumi Lye, M.D.

## 2017-03-05 ENCOUNTER — Encounter: Payer: Self-pay | Admitting: Family Medicine

## 2017-03-05 ENCOUNTER — Ambulatory Visit (INDEPENDENT_AMBULATORY_CARE_PROVIDER_SITE_OTHER): Payer: BLUE CROSS/BLUE SHIELD | Admitting: Family Medicine

## 2017-03-05 VITALS — BP 113/75 | HR 81 | Temp 97.3°F | Ht 62.0 in | Wt 175.8 lb

## 2017-03-05 DIAGNOSIS — M5412 Radiculopathy, cervical region: Secondary | ICD-10-CM | POA: Diagnosis not present

## 2017-03-05 MED ORDER — CYCLOBENZAPRINE HCL 10 MG PO TABS: 10.0000 mg | ORAL_TABLET | Freq: Three times a day (TID) | ORAL | 0 refills | Status: DC | PRN

## 2017-03-05 MED ORDER — NAPROXEN 500 MG PO TABS: 500.0000 mg | ORAL_TABLET | Freq: Two times a day (BID) | ORAL | 0 refills | Status: DC

## 2017-03-05 NOTE — Patient Instructions (Signed)
Great to meet you!  Start naproxen 1 pill twice daily with food for about 1 week, you may also try flexeril but know it may make you very sleepy.

## 2017-03-05 NOTE — Progress Notes (Signed)
   HPI  Patient presents today here with left neck pain.  Patient explains that she's had one day symptom of left shoulder and left neck pain. She states that it comes up from her upper left chest up to her neck and down to her fingers. She had an episode similar to this previously requiring physical therapy. Had neck stiffness at that time  400 mg of ibuprofen has been helping.   PMH: Smoking status noted ROS: Per HPI  Objective: BP 113/75   Pulse 81   Temp (!) 97.3 F (36.3 C) (Oral)   Ht 5\' 2"  (1.575 m)   Wt 175 lb 12.8 oz (79.7 kg)   BMI 32.15 kg/m  Gen: NAD, alert, cooperative with exam HEENT: NCAT CV: RRR, good S1/S2, no murmur Resp: CTABL, no wheezes, non-labored Ext: No edema, warm Neuro: Alert and oriented, No gross deficits MSK:  Tenderness to palpation of bony landmarks or muscular landmarks of the left shoulder, no tenderness of the paraspinal muscles or midline spine in the cervical area. Negative Hawkins and M.D. can test.   Assessment and plan:  # Cervical radiculopathy Symptoms most consistent with cervical radiculopathy, it is mildly responsive to NSAIDs Scheduled in terms 12 weeks, also trial of Flexeril. Low threshold for follow-up, may need physical therapy again.   Meds ordered this encounter  Medications  . naproxen (NAPROSYN) 500 MG tablet    Sig: Take 1 tablet (500 mg total) by mouth 2 (two) times daily with a meal.    Dispense:  30 tablet    Refill:  0  . cyclobenzaprine (FLEXERIL) 10 MG tablet    Sig: Take 1 tablet (10 mg total) by mouth 3 (three) times daily as needed for muscle spasms.    Dispense:  30 tablet    Refill:  0    Murtis SinkSam Ellene Bloodsaw, MD [facility] 03/05/2017, 3:54 PM

## 2017-03-09 ENCOUNTER — Telehealth: Payer: Self-pay | Admitting: Family Medicine

## 2017-03-09 NOTE — Telephone Encounter (Signed)
She now has a rash and small red bumps   Nothing new other than meds   She states the meds didn't really help the neck pain any way - she is aware to stop them   Any recommendations? 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

## 2017-03-09 NOTE — Telephone Encounter (Signed)
Stop the naproxen first and see if it improves. Can take benadryl for severe itching if needed. If not much difference in the next 48 hours, stop cyclobenzaprine.

## 2017-03-09 NOTE — Telephone Encounter (Signed)
Pt aware.

## 2017-03-09 NOTE — Telephone Encounter (Signed)
LM 7/27-jhb - she was started on these 2 meds on 7/23 by Dr Ermalinda MemosBradshaw

## 2017-05-10 DIAGNOSIS — Z6832 Body mass index (BMI) 32.0-32.9, adult: Secondary | ICD-10-CM | POA: Diagnosis not present

## 2017-05-10 DIAGNOSIS — Z01419 Encounter for gynecological examination (general) (routine) without abnormal findings: Secondary | ICD-10-CM | POA: Diagnosis not present

## 2017-06-11 ENCOUNTER — Ambulatory Visit (INDEPENDENT_AMBULATORY_CARE_PROVIDER_SITE_OTHER): Payer: BLUE CROSS/BLUE SHIELD | Admitting: Family Medicine

## 2017-06-11 ENCOUNTER — Encounter: Payer: Self-pay | Admitting: Family Medicine

## 2017-06-11 VITALS — BP 109/61 | HR 81 | Temp 98.3°F | Ht 62.0 in | Wt 174.0 lb

## 2017-06-11 DIAGNOSIS — L259 Unspecified contact dermatitis, unspecified cause: Secondary | ICD-10-CM

## 2017-06-11 DIAGNOSIS — M7751 Other enthesopathy of right foot: Secondary | ICD-10-CM | POA: Diagnosis not present

## 2017-06-11 MED ORDER — DICLOFENAC SODIUM 75 MG PO TBEC: 75.0000 mg | DELAYED_RELEASE_TABLET | Freq: Two times a day (BID) | ORAL | 0 refills | Status: DC

## 2017-06-11 MED ORDER — FLUOCINONIDE-E 0.05 % EX CREA: 1.0000 "application " | TOPICAL_CREAM | Freq: Two times a day (BID) | CUTANEOUS | 0 refills | Status: DC

## 2017-06-11 NOTE — Addendum Note (Signed)
Addended by: Margurite AuerbachOMPTON, Suezette Lafave G on: 06/11/2017 05:23 PM   Modules accepted: Orders

## 2017-06-11 NOTE — Progress Notes (Addendum)
Subjective:  Patient ID: Shannon Moon, female    DOB: 05/07/1986  Age: 31 y.o. MRN: [id number]  CC: Foot Pain (pt here today c/o right foot pain which she had injured last year and now says when it rains she notices a throbbing feeling and stiffness in it. She is also c/o rash on left wrist.)   HPI Shannon HowellsDevona Y Barnhart presents for Pain seems to have suddenly started worsening over the last few weeks. Pain is on the dorsum of the right foot from the toes to the heel at the lateral aspect. It is a sharp pain. She is on her feet a lot at work and that seems to make it worse. Additionally her son got some rash on him several days ago after looking at the day or 2 later she noticed a couple of spots on the left wrist. She's had no fever chills sweats pain etc. There has been some itch. It's actually getting somewhat better.  Dep[facility] 06/11/2017 03/05/2017 10/30/2016  Decreased Interest 0 0 0  Down, Depressed, Hopeless 0 0 0  PHQ - 2 Score 0 0 0    History Jahayra has a past medical history of Pregnancy.   She has a past surgical history that includes Cesarean section; Tonsillectomy; and Adenoidectomy.   Her family history is not on file.She reports that she has never smoked. She has never used smokeless tobacco. She reports that she does not drink alcohol or use drugs.    ROS Review of Systems  Constitutional: Negative for activity change, appetite change and fever.  HENT: Negative for congestion, rhinorrhea and sore throat.   Eyes: Negative for visual disturbance.  Respiratory: Negative for cough and shortness of breath.   Cardiovascular: Negative for chest pain and palpitations.  Gastrointestinal: Negative for abdominal pain, diarrhea and nausea.  Genitourinary: Negative for dysuria.  Musculoskeletal: Positive for arthralgias (at the right ankle over the lateral malleolus and radiating into the forefoot and midfoot on the right). Negative for myalgias.    Objective:    BP 109/61   Pulse 81   Temp 98.3 F (36.8 C) (Oral)   Ht 5\' 2"  (1.575 m)   Wt 174 lb (78.9 kg)   BMI 31.83 kg/m   BP Readings from Last 3 Encounters:  06/11/17 109/61  03/05/17 113/75  10/30/16 108/70    Wt Readings from Last 3 Encounters:  06/11/17 174 lb (78.9 kg)  03/05/17 175 lb 12.8 oz (79.7 kg)  10/30/16 174 lb (78.9 kg)     Physical Exam  Constitutional: She appears well-developed and well-nourished.  HENT:  Head: Normocephalic.  Cardiovascular: Normal rate and regular rhythm.   No murmur heard. Pulmonary/Chest: Effort normal and breath sounds normal.  Musculoskeletal: Normal range of motion. She exhibits tenderness (noted at the lateral malleolus on the right foot worsened by eversion/inversion as well as dorsiflexion and plantar flexion. Very painful for resisted motion. No edema.).  Skin: Skin is warm and dry. Rash (here is a scaly slightly pigmented 1 cm lesion at the left ventral wrist) noted.      Assessment & Plan:   Rutha BouchardDevona was seen today for foot pain.  Diagnoses and all orders for this visit:  Tendinitis of right foot -     Ambulatory referral to Physical Therapy  Contact dermatitis and eczema  Other orders -     diclofenac (VOLTAREN) 75 MG EC tablet; Take 1 tablet (75 mg total) by mouth 2 (two) times daily. -  fluocinonide-emollient (LIDEX-E) 0.05 % cream; Apply 1 application topically 2 (two) times daily.     Patient should wear a cast boot anytime she is on her feet for the next couple of weeks.  I have discontinued Ms. Havener's naproxen and cyclobenzaprine. I am also having her start on diclofenac and fluocinonide-emollient. Additionally, I am having her maintain her etonogestrel.  Allergies as of 06/11/2017   No Known Allergies     Medication List       Accurate as of 06/11/17  5:33 PM. Always use your most recent med list.          diclofenac 75 MG EC tablet Commonly known as:  VOLTAREN Take 1 tablet (75 mg total) by  mouth 2 (two) times daily.   etonogestrel 68 MG Impl implant Commonly known as:  NEXPLANON 68 mg.   fluocinonide-emollient 0.05 % cream Commonly known as:  LIDEX-E Apply 1 application topically 2 (two) times daily.      Diclofenac 75 twice a day. Physical therapy ordered.  Follow-up: Return in about 2 weeks (around 06/25/2017).  Mechele Claude, M.D.

## 2017-06-18 ENCOUNTER — Ambulatory Visit (INDEPENDENT_AMBULATORY_CARE_PROVIDER_SITE_OTHER): Payer: BLUE CROSS/BLUE SHIELD

## 2017-06-18 ENCOUNTER — Telehealth: Payer: Self-pay

## 2017-06-18 ENCOUNTER — Other Ambulatory Visit (INDEPENDENT_AMBULATORY_CARE_PROVIDER_SITE_OTHER): Payer: BLUE CROSS/BLUE SHIELD

## 2017-06-18 DIAGNOSIS — M79671 Pain in right foot: Secondary | ICD-10-CM | POA: Diagnosis not present

## 2017-06-18 DIAGNOSIS — M19071 Primary osteoarthritis, right ankle and foot: Secondary | ICD-10-CM | POA: Diagnosis not present

## 2017-06-18 NOTE — Telephone Encounter (Signed)
Yes    Please order

## 2017-06-18 NOTE — Telephone Encounter (Signed)
Orders placed. LM for patient to call back

## 2017-06-18 NOTE — Telephone Encounter (Signed)
Wants to see if she can come in for an xray before going to PT

## 2017-06-18 NOTE — Telephone Encounter (Signed)
Patient aware and will come by for xray 

## 2017-06-19 ENCOUNTER — Telehealth: Payer: Self-pay | Admitting: Family Medicine

## 2017-06-19 NOTE — Telephone Encounter (Signed)
Pt notified of results Verbalizes understanding 

## 2017-06-19 NOTE — Telephone Encounter (Signed)
Pt notified of results and recommendation Verbalizes understanding 

## 2017-06-25 ENCOUNTER — Encounter: Payer: Self-pay | Admitting: *Deleted

## 2017-06-25 ENCOUNTER — Ambulatory Visit: Payer: BLUE CROSS/BLUE SHIELD | Admitting: Family Medicine

## 2017-06-25 ENCOUNTER — Encounter: Payer: Self-pay | Admitting: Family Medicine

## 2017-06-25 VITALS — BP 110/73 | HR 81 | Temp 97.7°F | Ht 62.0 in | Wt 174.0 lb

## 2017-06-25 DIAGNOSIS — M7751 Other enthesopathy of right foot: Secondary | ICD-10-CM

## 2017-06-25 NOTE — Progress Notes (Signed)
Chief Complaint  Patient presents with  . Follow-up    right foot     HPI  Patient presents today for follow-up of her foot pain.  It has not improved.  In fact it may actually be worse and that all of the rain we have had lately has caused an increase to 10/10 pain all over the foot during the rain.  She is still trying to work.  She is wearing the boot while at work.  She has not been to physical therapy because she wanted to know the results of the x-ray first.  PMH: Smoking status noted ROS: Per HPI  Objective: BP 110/73 (BP Location: Right Arm)   Pulse 81   Temp 97.7 F (36.5 C) (Oral)   Ht 5\' 2"  (1.575 m)   Wt 174 lb (78.9 kg)   BMI 31.83 kg/m  Gen: NAD, alert, cooperative with exam HEENT: NCAT, EOMI, PERRL Ext: Mild, puffy midfoot edema on the right, warm.  Moderately tender over the dorsum of the right foot.  There is painful eversion and inversion.  Neurovascularly intact Neuro: Alert and oriented, No gross deficits  Assessment and plan:  1. Tendinitis of right foot     I encouraged patient to attend physical therapy.  Stay off the foot is much as possible I deal with be off work and stay off the foot.  When she has to be on the foot where the cast boot.  No orders of the defined types were placed in this encounter.   Follow up in 2 weeks Mechele ClaudeWarren Shiro Ellerman, MD

## 2017-06-26 ENCOUNTER — Telehealth: Payer: Self-pay | Admitting: Family Medicine

## 2017-06-26 NOTE — Telephone Encounter (Signed)
Please  write and I will sign. Thanks, WS 

## 2017-06-27 ENCOUNTER — Encounter: Payer: Self-pay | Admitting: Physical Therapy

## 2017-06-27 ENCOUNTER — Ambulatory Visit: Payer: BLUE CROSS/BLUE SHIELD | Attending: Family Medicine | Admitting: Physical Therapy

## 2017-06-27 DIAGNOSIS — M25571 Pain in right ankle and joints of right foot: Secondary | ICD-10-CM

## 2017-06-27 DIAGNOSIS — M25671 Stiffness of right ankle, not elsewhere classified: Secondary | ICD-10-CM | POA: Insufficient documentation

## 2017-06-27 DIAGNOSIS — M6281 Muscle weakness (generalized): Secondary | ICD-10-CM | POA: Diagnosis not present

## 2017-06-27 NOTE — Therapy (Signed)
[facility] [address] [place], [place], [address] Phone: [phone]   Fax:  [phone]  Physical Therapy Evaluation  Patient Details  Name: Shannon HowellsDevona Y Maisel MRN: [id number] Date of Birth: 02/13/1986 Referring Provider: Mechele ClaudeWarren Stacks MD   Encounter Date: 06/27/2017  PT End of Session - 06/27/17 1749    Visit Number  1    Number of Visits  12    Date for PT Re-Evaluation  08/08/17    PT Start Time  0145    PT Stop Time  0226    PT Time Calculation (min)  41 min    Activity Tolerance  Patient tolerated treatment well    Behavior During Therapy  [facility] for tasks assessed/performed       Past Medical History:  Diagnosis Date  . Pregnancy     Past Surgical History:  Procedure Laterality Date  . ADENOIDECTOMY    . CESAREAN SECTION    . TONSILLECTOMY      There were no vitals filed for this visit.   Subjective Assessment - 06/27/17 1752    Subjective  The patient reports about three weeks ago her right foot began to hurt for no apparent reason.  She sprained this ankle 2+ years ago but reports no significnat problems since then.  She reports as a chiled she was told she had "no arches" in her feet and has used orthotics in the past.  Recently she states she changed shoes but this was not helpeful.  She has been in a CAM boot the last 2 weeks.  He states her pain has been rising to highs of an 8/10 when putting pressure on her right foot.  Elevation decreases her pain.    Pertinent History  Right ankle sprain 4/18.    Patient Stated Goals  Walk without pain.    Currently in Pain?  Yes    Pain Score  8     Pain Location  Foot    Pain Orientation  Right    Pain Descriptors / Indicators  Aching "Hurts."    Pain Type  Acute pain    Pain Onset  1 to 4 weeks ago    Pain Frequency  Constant    Aggravating Factors   Weight bearing.    Pain Relieving Factors  Elevation.         [facility] - 06/27/17 0001      Assessment    Medical Diagnosis  Tendonitis of right foot.    Referring Provider  Mechele ClaudeWarren Stacks MD    Onset Date/Surgical Date  -- 3 weeks.      Precautions   Precautions  None      Restrictions   Weight Bearing Restrictions  No      Balance Screen   Has the patient fallen in the past 6 months  No    Has the patient had a decrease in activity level because of a fear of falling?   No    Is the patient reluctant to leave their home because of a fear of falling?   No      Home Public house managernvironment   Living Environment  Private residence      Prior Function   Level of Independence  Independent      Posture/Postural Control   Posture Comments  Unable to assess foot posture as patient unable to full weight bear over her right LE.      ROM / Strength   AROM /  PROM / Strength  AROM;Strength      AROM   Overall AROM Comments  Right ankle dorsiflexion to 0 degrees; inversion= 35 degrees and eversion to 0 degrees.      Strength   Overall Strength Comments  Right ankle eversion= 3+ to 4-/5 limited at least in part to pain.      Palpation   Palpation comment  Tennder to palpation over base of right 4th/5th MT and medial arch region of right foot.      Ambulation/Gait   Gait Comments  Ind ambulation with right CAM boot.             Objective measurements completed on examination: See above findings.      [facility] Adult PT Treatment/Exercise - 06/27/17 0001      Modalities   Modalities  Electrical Stimulation      Electrical Stimulation   Electrical Stimulation Location  Pre-mod to base of 4th/5th MT on right.    Electrical Stimulation Action  Pre-mod.    Electrical Stimulation Parameters  80-150 Hz x 15 minutes.    Electrical Stimulation Goals  Pain                  PT Long Term Goals - 06/27/17 1807      PT LONG TERM GOAL #1   Title  Independent with a HEP.    Time  6    Period  Weeks    Status  New      PT LONG TERM GOAL #2   Title  Increase ankle dorsiflexion to 8  degrees to normalize the patient's gait pattern.    Time  6    Period  Weeks    Status  New      PT LONG TERM GOAL #3   Title  Increase right ankle strength to 4+ to 5/5 to increase stability for functional tasks.    Time  6    Period  Weeks    Status  New      PT LONG TERM GOAL #4   Title  Walk a community distance with pain not > 2/10.    Time  6    Period  Weeks    Status  New             Plan - 06/27/17 1804    Clinical Impression Statement  The patient presents to OPPT with right dorsal and plantar surface foot pain.  Her functional mobility is impaired as she is walking in a CAM boot.  She is weak into right ankle eversion.  Patient will benefit from skilled physical therapy intervention.    History and Personal Factors relevant to plan of care:  Right nakle sprain 2 years+ ago.    Clinical Presentation  Stable    Clinical Presentation due to:  CAM boot compliance.    Clinical Decision Making  Low    Rehab Potential  Excellent    PT Frequency  2x / week    PT Duration  6 weeks    PT Treatment/Interventions  ADLs/Self Care Home Management;Cryotherapy;Clinical cytogeneticistlectrical Stimulation;Gait training;Therapeutic activities;Therapeutic exercise;Neuromuscular re-education;Patient/family education;Passive range of motion;Manual techniques;Vasopneumatic Device    PT Next Visit Plan  Right ankle ROM; Nustep; T-band exercises; ankle isolator; modalites; proprioceptive activites.    Consulted and Agree with Plan of Care  Patient       Patient will benefit from skilled therapeutic intervention in order to improve the following deficits and impairments:  Pain, Decreased activity tolerance,  Decreased range of motion, Decreased strength  Visit Diagnosis: Pain in right ankle and joints of right foot - Plan: PT plan of care cert/re-cert  Muscle weakness (generalized) - Plan: PT plan of care cert/re-cert  Stiffness of right ankle, not elsewhere classified - Plan: PT plan of care  cert/re-cert     Problem List There are no active problems to display for this patient.   Leighanna Kirn, [place] MPT 06/27/2017, 6:12 PM  [facility] [address] [place], [place], [address] Phone: [phone]   Fax:  [phone]  Name: LANE KJOS MRN: [id number] Date of Birth: [birth year]

## 2017-06-27 NOTE — Telephone Encounter (Signed)
Patient aware letter is ready for pick up.

## 2017-07-02 ENCOUNTER — Encounter: Payer: Self-pay | Admitting: Physical Therapy

## 2017-07-02 ENCOUNTER — Ambulatory Visit: Payer: BLUE CROSS/BLUE SHIELD | Admitting: Physical Therapy

## 2017-07-02 DIAGNOSIS — M6281 Muscle weakness (generalized): Secondary | ICD-10-CM | POA: Diagnosis not present

## 2017-07-02 DIAGNOSIS — M25671 Stiffness of right ankle, not elsewhere classified: Secondary | ICD-10-CM | POA: Diagnosis not present

## 2017-07-02 DIAGNOSIS — M25571 Pain in right ankle and joints of right foot: Secondary | ICD-10-CM

## 2017-07-02 NOTE — Patient Instructions (Addendum)
Ankle Alphabet    Using left ankle and foot only, trace the letters of the alphabet. Perform A to Z. Repeat __1__ times per set. Do ____ sets per session. Do __2-3__ sessions per day.  http://orth.exer.us/16   Copyright  VHI. All rights reserved.  Heel Raise (Sitting)    Raise heels, keeping toes on floor. Repeat __10__ times per set. Do __2__ sets per session. Do _2-3___ sessions per day.  http://orth.exer.us/44   Copyright  VHI. All rights reserved.  Toe Raise (Sitting)    Raise toes, keeping heels on floor. Repeat _10___ times per set. Do __2__ sets per session. Do _2-3___ sessions per day.  http://orth.exer.us/46   Copyright  VHI. All rights reserved.

## 2017-07-02 NOTE — Therapy (Signed)
[facility] [address] [place], [place], [address] Phone: [phone]   Fax:  [phone]  Physical Therapy Treatment  Patient Details  Name: Shannon Moon MRN: [id number] Date of Birth: 01/07/1986 Referring Provider: Mechele ClaudeWarren Stacks MD   Encounter Date: 07/02/2017  PT End of Session - 07/02/17 1519    Visit Number  2    Number of Visits  12    Date for PT Re-Evaluation  08/08/17    PT Start Time  1518    PT Stop Time  1611    PT Time Calculation (min)  53 min    Activity Tolerance  Patient tolerated treatment well    Behavior During Therapy  [facility] for tasks assessed/performed       Past Medical History:  Diagnosis Date  . Pregnancy     Past Surgical History:  Procedure Laterality Date  . ADENOIDECTOMY    . CESAREAN SECTION    . TONSILLECTOMY      There were no vitals filed for this visit.  Subjective Assessment - 07/02/17 1515    Subjective  Reports that her foot is very sore as she just got off work and stands on concrete floors.  Reports that if she is not better than they will complete MRI.    Pertinent History  Right ankle sprain 4/18.    Patient Stated Goals  Walk without pain.    Currently in Pain?  Yes    Pain Score  8     Pain Location  Ankle    Pain Orientation  Right    Pain Descriptors / Indicators  Sore    Pain Type  Acute pain    Pain Onset  1 to 4 weeks ago         [facility] Assessment - 07/02/17 0001      Assessment   Medical Diagnosis  Tendonitis of right foot.    Next MD Visit  07/11/2017      Precautions   Precautions  None      Restrictions   Weight Bearing Restrictions  No                  [facility] Adult PT Treatment/Exercise - 07/02/17 0001      Exercises   Exercises  Ankle      Modalities   Modalities  Electrical Stimulation;Vasopneumatic      Electrical Stimulation   Electrical Stimulation Location  R ankle    Electrical Stimulation Action  Pre-Mod    Electrical  Stimulation Parameters  80-150 hz x15 min    Electrical Stimulation Goals  Pain      Vasopneumatic   Number Minutes Vasopneumatic   15 minutes    Vasopnuematic Location   Ankle    Vasopneumatic Pressure  Medium    Vasopneumatic Temperature   63      Ankle Exercises: Aerobic   Stationary Bike  NuStep L4 x10 min      Ankle Exercises: Seated   ABC's  1 rep    Towel Crunch  Other (comment) x2 min    Heel Raises  15 reps    Toe Raise  15 reps    BAPS  Sitting;Level 2;10 reps    Other Seated Ankle Exercises  Seated R ankle rockerboard DF/PF and Inv/Ev x2 min each; R ankle dynadisc circles x20 reps             PT Education - 07/02/17 1613    Education provided  Yes    Education Details  HEP- ABCs, heel and toe raises in sitting    Person(s) Educated  Patient    Methods  Explanation;Handout    Comprehension  Verbalized understanding          PT Long Term Goals - 06/27/17 1807      PT LONG TERM GOAL #1   Title  Independent with a HEP.    Time  6    Period  Weeks    Status  New      PT LONG TERM GOAL #2   Title  Increase ankle dorsiflexion to 8 degrees to normalize the patient's gait pattern.    Time  6    Period  Weeks    Status  New      PT LONG TERM GOAL #3   Title  Increase right ankle strength to 4+ to 5/5 to increase stability for functional tasks.    Time  6    Period  Weeks    Status  New      PT LONG TERM GOAL #4   Title  Walk a community distance with pain not > 2/10.    Time  6    Period  Weeks    Status  New            Plan - 07/02/17 1603    Clinical Impression Statement  Patient presented in clinic with reports of R ankle soreness from weightbearing at work. Patient able to complete exercises in sitting and without resistance without complaint of pain. Patient did report 4th and 5th metatarsal pain at times as well as a burning sensation. Patient educated in importance of elevating and icing to assist with pain and edema control for 15-20  minutes at a time. Paitent provided an HEP with verbal instruction regarding parameters with patient verbalizing acceptance. Normal modalities response noted following removal of the modalities.    Rehab Potential  Excellent    PT Frequency  2x / week    PT Duration  6 weeks    PT Treatment/Interventions  ADLs/Self Care Home Management;Cryotherapy;Clinical cytogeneticistlectrical Stimulation;Gait training;Therapeutic activities;Therapeutic exercise;Neuromuscular re-education;Patient/family education;Passive range of motion;Manual techniques;Vasopneumatic Device    PT Next Visit Plan  Right ankle ROM; Nustep; T-band exercises; ankle isolator; modalites; proprioceptive activites.    Consulted and Agree with Plan of Care  Patient       Patient will benefit from skilled therapeutic intervention in order to improve the following deficits and impairments:  Pain, Decreased activity tolerance, Decreased range of motion, Decreased strength  Visit Diagnosis: Pain in right ankle and joints of right foot  Muscle weakness (generalized)  Stiffness of right ankle, not elsewhere classified     Problem List There are no active problems to display for this patient.   Marvell FullerKelsey P Larayah Clute, PTA 07/02/2017, 4:19 PM  [facility] [address] [place], [place], [address] Phone: [phone]   Fax:  [phone]  Name: Shannon Moon MRN: [id number] Date of Birth: 11/07/1985

## 2017-07-09 ENCOUNTER — Encounter: Payer: Self-pay | Admitting: Physical Therapy

## 2017-07-09 ENCOUNTER — Ambulatory Visit: Payer: BLUE CROSS/BLUE SHIELD | Admitting: Physical Therapy

## 2017-07-09 DIAGNOSIS — M6281 Muscle weakness (generalized): Secondary | ICD-10-CM | POA: Diagnosis not present

## 2017-07-09 DIAGNOSIS — M25571 Pain in right ankle and joints of right foot: Secondary | ICD-10-CM

## 2017-07-09 DIAGNOSIS — M25671 Stiffness of right ankle, not elsewhere classified: Secondary | ICD-10-CM | POA: Diagnosis not present

## 2017-07-09 NOTE — Therapy (Signed)
[facility] [address] [place], [place], [address] Phone: [phone]   Fax:  [phone]  Physical Therapy Treatment  Patient Details  Name: Shannon HowellsDevona Y Parveen MRN: [id number] Date of Birth: 11/10/1985 Referring Provider: Mechele ClaudeWarren Stacks MD   Encounter Date: 07/09/2017  PT End of Session - 07/09/17 1525    Visit Number  3    Number of Visits  12    Date for PT Re-Evaluation  08/08/17    PT Start Time  1518    PT Stop Time  1600    PT Time Calculation (min)  42 min    Activity Tolerance  Patient tolerated treatment well    Behavior During Therapy  [facility] for tasks assessed/performed       Past Medical History:  Diagnosis Date  . Pregnancy     Past Surgical History:  Procedure Laterality Date  . ADENOIDECTOMY    . CESAREAN SECTION    . TONSILLECTOMY      There were no vitals filed for this visit.  Subjective Assessment - 07/09/17 1523    Subjective  Patient reported feeling about the same at this time    Pertinent History  Right ankle sprain 4/18.    Patient Stated Goals  Walk without pain.    Currently in Pain?  Yes    Pain Score  8     Pain Location  Ankle    Pain Orientation  Right    Pain Descriptors / Indicators  Sore    Pain Type  Acute pain    Pain Onset  1 to 4 weeks ago    Pain Frequency  Constant    Aggravating Factors   weight bearing    Pain Relieving Factors  rest                      [facility] Adult PT Treatment/Exercise - 07/09/17 0001      Exercises   Exercises  Ankle;Knee/Hip      Knee/Hip Exercises: Supine   Straight Leg Raises  Strengthening;Right;20 reps      Knee/Hip Exercises: Sidelying   Hip ABduction  Strengthening;Right;20 reps      Electrical Stimulation   Electrical Stimulation Location  R ankle    Electrical Stimulation Action  premod    Electrical Stimulation Parameters  80-150hz  x2815min    Electrical Stimulation Goals  Pain      Vasopneumatic   Number Minutes  Vasopneumatic   15 minutes    Vasopnuematic Location   Ankle    Vasopneumatic Pressure  Medium      Ankle Exercises: Aerobic   Stationary Bike  NuStep L4 x10 min      Ankle Exercises: Seated   Heel Raises  20 reps    Toe Raise  20 reps    BAPS  Sitting;Level 2;5 reps each way    Other Seated Ankle Exercises  Seated R ankle rockerboard DF/PF and Inv/Ev x2 min each; R ankle dynadisc circles x20 reps                  PT Long Term Goals - 07/09/17 1530      PT LONG TERM GOAL #1   Title  Independent with a HEP.    Time  6    Period  Weeks    Status  On-going      PT LONG TERM GOAL #2   Title  Increase ankle dorsiflexion to 8 degrees to normalize the patient's  gait pattern.    Time  6    Period  Weeks    Status  On-going      PT LONG TERM GOAL #3   Title  Increase right ankle strength to 4+ to 5/5 to increase stability for functional tasks.    Time  6    Period  Weeks    Status  On-going      PT LONG TERM GOAL #4   Title  Walk a community distance with pain not > 2/10.    Time  6    Period  Weeks    Status  On-going            Plan - 07/09/17 1547    Clinical Impression Statement  Patient tolerated treatment fair today with reports of ongoing throbbing pain. Patient has to continue to work which requires prolong standing thus causing increased discomfort. Today continued to slowly progress patient with ROM and gentle exercises due to high pain level. Patient has FU appt with MD wednesday and will determine cont POC. Current goals ongoing due to pain limitations.     Rehab Potential  Excellent    PT Frequency  2x / week    PT Duration  6 weeks    PT Treatment/Interventions  ADLs/Self Care Home Management;Cryotherapy;Clinical cytogeneticistlectrical Stimulation;Gait training;Therapeutic activities;Therapeutic exercise;Neuromuscular re-education;Patient/family education;Passive range of motion;Manual techniques;Vasopneumatic Device    PT Next Visit Plan  to cont with POC per MD? MPT  POC for Right ankle ROM; Nustep; T-band exercises; ankle isolator; modalites; proprioceptive activites.    Consulted and Agree with Plan of Care  Patient       Patient will benefit from skilled therapeutic intervention in order to improve the following deficits and impairments:  Pain, Decreased activity tolerance, Decreased range of motion, Decreased strength  Visit Diagnosis: Pain in right ankle and joints of right foot  Muscle weakness (generalized)  Stiffness of right ankle, not elsewhere classified     Problem List There are no active problems to display for this patient.   Cathie HoopsChristina Verleen Stuckey, PTA 07/09/17 4:00 PM  [facility] [address] [place], [place], [address] Phone: [phone]   Fax:  [phone]  Name: Shannon HowellsDevona Y Moon MRN: [id number] Date of Birth: 01/22/1986

## 2017-07-11 ENCOUNTER — Encounter: Payer: Self-pay | Admitting: Family Medicine

## 2017-07-11 ENCOUNTER — Ambulatory Visit: Payer: BLUE CROSS/BLUE SHIELD | Admitting: Family Medicine

## 2017-07-11 VITALS — BP 117/73 | HR 85 | Temp 97.9°F | Ht 62.0 in | Wt 177.0 lb

## 2017-07-11 DIAGNOSIS — M7750 Other enthesopathy of unspecified foot: Secondary | ICD-10-CM

## 2017-07-11 NOTE — Progress Notes (Signed)
Chief Complaint  Patient presents with  . Follow-up    pt here today following up on right foot tendonitis    HPI  Patient presents today for stating that the right foot is about as painful as it has been from the start.  She is been to physical therapy on 3 occasions.  She has been wearing the cast boot.  The pain is still moderate 5-6/10 at the lateral midfoot.  PMH: Smoking status noted ROS: Per HPI  Objective: BP 117/73   Pulse 85   Temp 97.9 F (36.6 C) (Oral)   Ht 5\' 2"  (1.575 m)   Wt 177 lb (80.3 kg)   BMI 32.37 kg/m  Gen: NAD, alert, cooperative with exam HEENT: NCAT, EOMI, PERRL CV: RRR, good S1/S2, no murmur Resp: CTABL, no wheezes, non-labored Ext: No edema, warm.  There is mild edema of the right foot at the lateral dorsal midfoot.  There is pain on inversion.  There is no edema and it is neurovascularly intact. Neuro: Alert and oriented, No gross deficits  Assessment and plan:  1. Tendonitis of ankle     No orders of the defined types were placed in this encounter.   Orders Placed This Encounter  Procedures  . Ambulatory referral to Orthopedic Surgery    Referral Priority:   Routine    Referral Type:   Surgical    Referral Reason:   Specialty Services Required    Requested Specialty:   Orthopedic Surgery    Number of Visits Requested:   1   Continue wearing the boot and continue with physical therapy.  Will transfer care to orthopedic for definitive care.  She needs to continue to be off her feet as much as possible however her work situation makes it necessary for her to be on her feet for 8 hours a day.  She cannot be off of her feet more than that without risking losing her job and benefits Follow up as needed.  Mechele ClaudeWarren Aalliyah Kilker, MD

## 2017-07-11 NOTE — Patient Instructions (Signed)
Use the cast boot when you will be on your feet walking a lot and use the ASO brace when walking is limited.

## 2017-07-23 ENCOUNTER — Ambulatory Visit (INDEPENDENT_AMBULATORY_CARE_PROVIDER_SITE_OTHER): Payer: BLUE CROSS/BLUE SHIELD | Admitting: Orthopaedic Surgery

## 2017-07-30 ENCOUNTER — Ambulatory Visit (INDEPENDENT_AMBULATORY_CARE_PROVIDER_SITE_OTHER): Payer: BLUE CROSS/BLUE SHIELD

## 2017-07-30 ENCOUNTER — Ambulatory Visit (INDEPENDENT_AMBULATORY_CARE_PROVIDER_SITE_OTHER): Payer: BLUE CROSS/BLUE SHIELD | Admitting: Orthopaedic Surgery

## 2017-07-30 ENCOUNTER — Encounter (INDEPENDENT_AMBULATORY_CARE_PROVIDER_SITE_OTHER): Payer: Self-pay | Admitting: Orthopaedic Surgery

## 2017-07-30 DIAGNOSIS — M25571 Pain in right ankle and joints of right foot: Secondary | ICD-10-CM

## 2017-07-30 NOTE — Progress Notes (Signed)
Office Visit Note   Patient: Shannon Moon           Date of Birth: 09/14/1985           MRN: [id number] Visit Date: 07/30/2017              Requested by: Mechele ClaudeStacks, Warren, MD [address] [place], [place] [address] PCP: Mechele ClaudeStacks, Warren, MD   Assessment & Plan: Visit Diagnoses:  1. Pain in right ankle and joints of right foot     Plan: I am concerned about dysfunction of the posterior tibial tendon.  At this point given the fact that she is failed 14 months of physical therapy, rest, activity modification, ice, anti-inflammatories and wearing a cam walking boot and continue to have these problems of instability of her ankle and pain an MRI is warranted at this point to assess the cartilage in the ligamentous structures of her right ankle.  All questions and concerns were answered and addressed.  We will see her back once the MRI is obtained.  Follow-Up Instructions: Return in about 2 weeks (around 08/13/2017).   Orders:  Orders Placed This Encounter  Procedures  . XR Ankle Complete Right   No orders of the defined types were placed in this encounter.     Procedures: No procedures performed   Clinical Data: No additional findings.   Subjective: Chief Complaint  Patient presents with  . Right Ankle - Pain  The patient is a very pleasant 31 year old female that is sent to me to evaluate a chronic issue with her right ankle.  She has had 14 months of treatment on her right ankle for tendinitis after an acute injury.  However she does have a flatfoot deformity as well.  She has not had significant problems before until she sprained her ankle.  She is been through rest, ice, heat, anti-inflammatories and multiple attempts of physical therapy on his ankle.  She is been wearing a Cam walker as well.  She points to the Achilles tendon area but also the medial and lateral aspect of her ankle as a source of her pain.  HPI  Review of Systems She denies any headache, chest pain,  shortness of breath, fever, chills, nausea, vomiting.  She denies any left ankle issues.  Objective: Vital Signs: There were no vitals taken for this visit.  Physical Exam She is alert and oriented x3 and in no acute distress Ortho Exam Examination of both her feet she has flat feet deformities.  They are not severe but there is certainly flat.  I had her try to go up on her toes on her right side and this is painful to do so but she can do it in her heel goes into significant varus after this.  She does have significant tenderness over the anterior talofibular ligament and there is a lot of shock in play in her ankle.  She has pain over the Achilles.  Thompson test is negative. Specialty Comments:  No specialty comments available.  Imaging: Xr Ankle Complete Right  Result Date: 07/30/2017 3 views of the right ankle show a normal-appearing ankle with no acute findings.  The ankle is well located.    PMFS History: There are no active problems to display for this patient.  Past Medical History:  Diagnosis Date  . Pregnancy     History reviewed. No pertinent family history.  Past Surgical History:  Procedure Laterality Date  . ADENOIDECTOMY    . CESAREAN SECTION    .  TONSILLECTOMY     Social History   Occupational History  . Not on file  Tobacco Use  . Smoking status: Never Smoker  . Smokeless tobacco: Never Used  Substance and Sexual Activity  . Alcohol use: No  . Drug use: No  . Sexual activity: Yes    Birth control/protection: None

## 2017-07-31 ENCOUNTER — Other Ambulatory Visit (INDEPENDENT_AMBULATORY_CARE_PROVIDER_SITE_OTHER): Payer: Self-pay

## 2017-07-31 DIAGNOSIS — M25571 Pain in right ankle and joints of right foot: Secondary | ICD-10-CM

## 2017-08-02 ENCOUNTER — Ambulatory Visit (INDEPENDENT_AMBULATORY_CARE_PROVIDER_SITE_OTHER): Payer: BLUE CROSS/BLUE SHIELD | Admitting: Orthopaedic Surgery

## 2017-08-09 ENCOUNTER — Ambulatory Visit
Admission: RE | Admit: 2017-08-09 | Discharge: 2017-08-09 | Disposition: A | Discharge status: Home / Self Care | Payer: BLUE CROSS/BLUE SHIELD | Source: Ambulatory Visit | Attending: Orthopaedic Surgery | Admitting: Orthopaedic Surgery

## 2017-08-09 DIAGNOSIS — M65851 Other synovitis and tenosynovitis, right thigh: Secondary | ICD-10-CM | POA: Diagnosis not present

## 2017-08-09 DIAGNOSIS — M25571 Pain in right ankle and joints of right foot: Secondary | ICD-10-CM

## 2017-08-16 ENCOUNTER — Ambulatory Visit (INDEPENDENT_AMBULATORY_CARE_PROVIDER_SITE_OTHER): Payer: BLUE CROSS/BLUE SHIELD | Admitting: Orthopaedic Surgery

## 2017-08-16 ENCOUNTER — Encounter (INDEPENDENT_AMBULATORY_CARE_PROVIDER_SITE_OTHER): Payer: Self-pay | Admitting: Orthopaedic Surgery

## 2017-08-16 DIAGNOSIS — M76821 Posterior tibial tendinitis, right leg: Secondary | ICD-10-CM | POA: Diagnosis not present

## 2017-08-16 NOTE — Progress Notes (Signed)
The patient is returning for follow-up after having an MRI of her right ankle due to chronic ankle pain issues.  She is been dealing with pain for about 14 months and has been going [facility] [place].  I feel like they were treating her more for a sprain than anything else and they were treating her appropriately but I do not think a particular diagnosis of what they are actually treating other than sprain.  She saw me after having been in a cam walker for months and this is mainly for second opinion.  At the time I examined her and felt that there was evidence of tendinitis of the posterior tibial tendon.  She has flat feet deformities as well.  I sent her for an MRI to evaluate the integrity of the posterior tibial tendon based on my exam.  She is here for review this today.  On exam again she has had flatfoot deformity.  She is able to do a toe raise but is definitely weak compared to the other side.  She has pain along the course the posterior tibial tendon.  MRI is reviewed and does show that the posterior tibial tendon is intact there is tenosynovitis of the tendon itself.  There is also some adjacent edema in the surrounding tissues causing some phlebitis of the vein in this area.  The Achilles tendon and all the other ankle ligaments and tendons are entirely intact and normal.  He does have a flatfoot deformity as well.  At this point I would like to send her back to outpatient physical therapy but to specifically work on modalities to decrease the dysfunction and pain of her right posterior tibial tendon.  We will also send her to [employer] for medial hindfoot wedges that can help in her shoes as well as a custom orthotic.  We will see her back in about 6 weeks to see if this is helped.

## 2017-08-17 ENCOUNTER — Other Ambulatory Visit (INDEPENDENT_AMBULATORY_CARE_PROVIDER_SITE_OTHER): Payer: Self-pay

## 2017-08-17 DIAGNOSIS — M25571 Pain in right ankle and joints of right foot: Secondary | ICD-10-CM

## 2017-08-23 ENCOUNTER — Ambulatory Visit: Payer: BLUE CROSS/BLUE SHIELD | Attending: Orthopaedic Surgery | Admitting: Physical Therapy

## 2017-08-23 DIAGNOSIS — M25671 Stiffness of right ankle, not elsewhere classified: Secondary | ICD-10-CM | POA: Insufficient documentation

## 2017-08-23 DIAGNOSIS — M6281 Muscle weakness (generalized): Secondary | ICD-10-CM

## 2017-08-23 DIAGNOSIS — M25571 Pain in right ankle and joints of right foot: Secondary | ICD-10-CM | POA: Insufficient documentation

## 2017-08-23 NOTE — Therapy (Signed)
[facility] [address] [place], [place], [address] Phone: [phone]   Fax:  [phone]  Physical Therapy Treatment  Patient Details  Name: Shannon Moon MRN: [id number] Date of Birth: 11/13/1985 Referring Provider: Mechele ClaudeWarren Stacks MD   Encounter Date: 08/23/2017  PT End of Session - 08/23/17 1016    Visit Number  4    Number of Visits  12    Date for PT Re-Evaluation  09/26/17    PT Start Time  0937    PT Stop Time  1012    PT Time Calculation (min)  35 min    Activity Tolerance  Patient tolerated treatment well    Behavior During Therapy  [facility] for tasks assessed/performed       Past Medical History:  Diagnosis Date  . Pregnancy     Past Surgical History:  Procedure Laterality Date  . ADENOIDECTOMY    . CESAREAN SECTION    . TONSILLECTOMY      There were no vitals filed for this visit.  Subjective Assessment - 08/23/17 1017    Subjective  The patient had an Orthopedic consult and has been measured for orthotics which she will pick-up next week.  She now is wearing a right ASO.  She reports her pain continues to be very high at the end of her workshift due to standing on concrete several hours.  Pain today is a 6/10.  She had an MRI that revealed:  Mild tenosynovitis of the posterior tibialis tendon.    Patient Stated Goals  Walk without pain.    Pain Score  6     Pain Location  Ankle    Pain Orientation  Right    Pain Descriptors / Indicators  Sore;Aching    Pain Onset  1 to 4 weeks ago    Aggravating Factors   See above.    Pain Relieving Factors  Rest.      Re-eval:  Dorsiflexion limited to neutral actively as is eversion.  Strength of right ankle remains at 3+ to 4-/5 which appears to be limited primarily by pain.  She is palpable tender over the base of her 4th and 5th MT's.  She also reports tenderness over the right Achille's tendon at the calcaneal attachment.  Her CC, however, is at the post tib attachment on  the navicular bone.  Her gait is antalgic with the ASO.                [facility]/Exercise - 08/23/17 0001      Exercises   Exercises  Knee/Hip      Knee/Hip Exercises: Aerobic   Stationary Bike  Level 2 x 10 minutes.                  PT Long Term Goals - 07/09/17 1530      PT LONG TERM GOAL #1   Title  Independent with a HEP.    Time  6    Period  Weeks    Status  On-going      PT LONG TERM GOAL #2   Title  Increase ankle dorsiflexion to 8 degrees to normalize the patient's gait pattern.    Time  6    Period  Weeks    Status  On-going      PT LONG TERM GOAL #3   Title  Increase right ankle strength to 4+ to 5/5 to increase stability for functional tasks.    Time  6    Period  Weeks    Status  On-going      PT LONG TERM GOAL #4   Title  Walk a community distance with pain not > 2/10.    Time  6    Period  Weeks    Status  On-going              Patient will benefit from skilled therapeutic intervention in order to improve the following deficits and impairments:     Visit Diagnosis: Pain in right ankle and joints of right foot  Stiffness of right ankle, not elsewhere classified  Muscle weakness (generalized)     Problem List Patient Active Problem List   Diagnosis Date Noted  . Posterior tibial tendinitis, right leg 08/16/2017    [place], [place] MPT 08/23/2017, 10:25 AM  [facility] [address] [place], [place], [address] Phone: [phone]   Fax:  [phone]  Name: Shannon Moon MRN: [id number] Date of Birth: [birth year]

## 2017-08-28 ENCOUNTER — Ambulatory Visit: Payer: BLUE CROSS/BLUE SHIELD | Admitting: Physical Therapy

## 2017-08-28 DIAGNOSIS — M25671 Stiffness of right ankle, not elsewhere classified: Secondary | ICD-10-CM

## 2017-08-28 DIAGNOSIS — M25571 Pain in right ankle and joints of right foot: Secondary | ICD-10-CM

## 2017-08-28 DIAGNOSIS — M6281 Muscle weakness (generalized): Secondary | ICD-10-CM | POA: Diagnosis not present

## 2017-08-28 NOTE — Therapy (Signed)
[facility] [address] [place], [place], [address] Phone: [phone]   Fax:  [phone]  Physical Therapy Treatment  Patient Details  Name: Shannon Moon MRN: [id number] Date of Birth: 08/13/1986 Referring Provider: Mechele ClaudeWarren Stacks MD   Encounter Date: 08/28/2017  PT End of Session - 08/28/17 1559    Visit Number  5    Number of Visits  12    Date for PT Re-Evaluation  09/26/17    PT Start Time  1557    PT Stop Time  1646    PT Time Calculation (min)  49 min    Activity Tolerance  No increased pain;Patient tolerated treatment well    Behavior During Therapy  [facility] for tasks assessed/performed       Past Medical History:  Diagnosis Date  . Pregnancy     Past Surgical History:  Procedure Laterality Date  . ADENOIDECTOMY    . CESAREAN SECTION    . TONSILLECTOMY      There were no vitals filed for this visit.  Subjective Assessment - 08/28/17 1559    Subjective  Patient reported right ankle pain 6/10. Pt's pain increases as she shifts weight to right leg. Patient reports walking at on concrete floors at work continues to be difficult due to pain; she unable to rest or elevate throughout the work day. Patient stated she is to recieve a shoe insert tomorrow.    Pertinent History  Right ankle sprain 4/18.    Patient Stated Goals  Walk without pain.    Currently in Pain?  Yes    Pain Score  6     Pain Location  Ankle    Pain Orientation  Right    Pain Descriptors / Indicators  Sore    Pain Type  Acute pain    Pain Onset  1 to 4 weeks ago    Pain Frequency  Constant                      [facility] Adult PT Treatment/Exercise - 08/28/17 1605      Exercises   Exercises  Ankle;Knee/Hip      Knee/Hip Exercises: Stretches   Gastroc Stretch  Right;2 reps;30 seconds    Soleus Stretch  Right;30 seconds;2 reps      Knee/Hip Exercises: Aerobic   Stationary Bike  L2 x10 min      Vasopneumatic   Number Minutes  Vasopneumatic   10 minutes    Vasopnuematic Location   Ankle    Vasopneumatic Pressure  Low    Vasopneumatic Temperature   34      Ankle Exercises: Stretches   Soleus Stretch  3 reps;30 seconds seated    Gastroc Stretch  3 reps;30 seconds seated      Ankle Exercises: Seated   Heel Raises  20 reps rockerboard    Toe Raise  20 reps rockerboard    BAPS  Sitting;Level 2;10 reps clockwise and counterclockwise    Other Seated Ankle Exercises  Seated right ankle rockerboard inversion and eversion, x20 each    Other Seated Ankle Exercises  Seated right ankle dorsiflexion, inversion, and eversion with Yelllow theraband 2x10 each                  PT Long Term Goals - 07/09/17 1530      PT LONG TERM GOAL #1   Title  Independent with a HEP.    Time  6    Period  Weeks    Status  On-going      PT LONG TERM GOAL #2   Title  Increase ankle dorsiflexion to 8 degrees to normalize the patient's gait pattern.    Time  6    Period  Weeks    Status  On-going      PT LONG TERM GOAL #3   Title  Increase right ankle strength to 4+ to 5/5 to increase stability for functional tasks.    Time  6    Period  Weeks    Status  On-going      PT LONG TERM GOAL #4   Title  Walk a community distance with pain not > 2/10.    Time  6    Period  Weeks    Status  On-going            Plan - 08/28/17 1650    Clinical Impression Statement  Patient responded well to treatment as seen by no increase of pain/discomfort during duration of treatment. Pain decreased to 4/10 in right ankle after vasopneumatic device. No adverse affects found upon removal of vasopneumatic device. Patient instructed to continue ice/heat as needed for pain relief; patient in agreement.    PT Frequency  2x / week    PT Duration  6 weeks    PT Treatment/Interventions  ADLs/Self Care Home Management;Cryotherapy;Clinical cytogeneticist;Therapeutic activities;Therapeutic exercise;Neuromuscular  re-education;Patient/family education;Passive range of motion;Manual techniques;Vasopneumatic Device    PT Next Visit Plan  Continue with plan of care, introduce weight bearing activities as tolerated.    Consulted and Agree with Plan of Care  Patient       Patient will benefit from skilled therapeutic intervention in order to improve the following deficits and impairments:  Pain, Decreased activity tolerance, Decreased range of motion, Decreased strength  Visit Diagnosis: Pain in right ankle and joints of right foot  Stiffness of right ankle, not elsewhere classified  Muscle weakness (generalized)     Problem List Patient Active Problem List   Diagnosis Date Noted  . Posterior tibial tendinitis, right leg 08/16/2017   Guss Bunde, PT, DPT 08/28/2017, 5:06 PM  [facility] [address] [place], [place], [address] Phone: [phone]   Fax:  [phone]  Name: Shannon Moon MRN: [id number] Date of Birth: 07/26/1986

## 2017-08-29 DIAGNOSIS — M2140 Flat foot [pes planus] (acquired), unspecified foot: Secondary | ICD-10-CM | POA: Diagnosis not present

## 2017-08-30 ENCOUNTER — Ambulatory Visit: Payer: BLUE CROSS/BLUE SHIELD | Admitting: Physical Therapy

## 2017-08-30 DIAGNOSIS — M25571 Pain in right ankle and joints of right foot: Secondary | ICD-10-CM

## 2017-08-30 DIAGNOSIS — M25671 Stiffness of right ankle, not elsewhere classified: Secondary | ICD-10-CM | POA: Diagnosis not present

## 2017-08-30 DIAGNOSIS — M6281 Muscle weakness (generalized): Secondary | ICD-10-CM

## 2017-08-30 NOTE — Therapy (Signed)
[facility] [address] [place], [place], [address] Phone: [phone]   Fax:  [phone]  Physical Therapy Treatment  Patient Details  Name: Shannon Moon MRN: [id number] Date of Birth: 01/16/1986 Referring Provider: Mechele ClaudeWarren Stacks MD   Encounter Date: 08/30/2017  PT End of Session - 08/30/17 1558    Visit Number  6    Number of Visits  12    Date for PT Re-Evaluation  09/26/17    PT Start Time  1603    PT Stop Time  1700    PT Time Calculation (min)  57 min    Activity Tolerance  Patient tolerated treatment well;Other (comment) "soreness" with exercises but tolerable    Behavior During Therapy  [facility] tasks assessed/performed       Past Medical History:  Diagnosis Date  . Pregnancy     Past Surgical History:  Procedure Laterality Date  . ADENOIDECTOMY    . CESAREAN SECTION    . TONSILLECTOMY      There were no vitals filed for this visit.  Subjective Assessment - 08/30/17 1627    Subjective  Patient reported she received her insole yesterday. She wore the insole for about 4 hours at work however pain increased about 8-9/10 and she removed the insole returned to wearing her brace. Patient reports feeling more stable with the brace.     Pertinent History  Right ankle sprain 4/18.    Currently in Pain?  Yes    Pain Score  6     Pain Location  Ankle    Pain Orientation  Right    Pain Descriptors / Indicators  Sore         [facility] PT Assessment - 08/30/17 0001      AROM   Overall AROM Comments  Right ankle dorsiflexion 8 degrees                  [facility] Adult PT Treatment/Exercise - 08/30/17 0001      Exercises   Exercises  Ankle;Knee/Hip      Knee/Hip Exercises: Aerobic   Stationary Bike  L2 x10 mins      Electrical Stimulation   Electrical Stimulation Location  Right Medial Ankle    Electrical Stimulation Action  Pre-mod    Electrical Stimulation Parameters  80-150 hz x10 minutes    Electrical  Stimulation Goals  Pain      Vasopneumatic   Number Minutes Vasopneumatic   10 minutes    Vasopnuematic Location   Ankle    Vasopneumatic Pressure  Low    Vasopneumatic Temperature   34      Manual Therapy   Manual Therapy  Passive ROM    Passive ROM  Right ankle PROM DF/PF/IN/EV, manual Gastroc stretch      Ankle Exercises: Stretches   Soleus Stretch  3 reps;30 seconds prostretch; standing    Gastroc Stretch  3 reps;30 seconds prostretch; standing      Ankle Exercises: Standing   Heel Raises  10 reps eccentric control      Ankle Exercises: Seated   BAPS  --    Other Seated Ankle Exercises  Seated right ankle rocker board IN/EV. DF/PF x20    Other Seated Ankle Exercises  Seated red theraband DF/PF/IN/EV focus on eccentric Inversion                  PT Long Term Goals - 08/30/17 1726      PT LONG TERM  GOAL #1   Title  Independent with a HEP.    Time  6    Period  Weeks    Status  Achieved      PT LONG TERM GOAL #2   Title  Increase ankle dorsiflexion to 8 degrees to normalize the patient's gait pattern.    Baseline  Right ankle dorsiflexion AROM 8 degrees    Time  6    Period  Weeks    Status  Achieved      PT LONG TERM GOAL #3   Title  Increase right ankle strength to 4+ to 5/5 to increase stability for functional tasks.    Time  6    Period  Weeks    Status  On-going      PT LONG TERM GOAL #4   Title  Walk a community distance with pain not > 2/10.    Time  6    Period  Weeks    Status  On-going            Plan - 08/30/17 1715    Clinical Impression Statement  Patient's treatment plan focused on eccentric control and stretching. Patient was able to complete treatment with only "soreness" along the posterior aspect of the medial malleolus and at the bottom of the foot. Patient instructed to continue HEP, ice and elevation to decrease pain and edema and improve overall function. Patient in agreement.    PT Frequency  2x / week    PT Duration  6  weeks    PT Treatment/Interventions  ADLs/Self Care Home Management;Cryotherapy;Clinical cytogeneticist;Therapeutic activities;Therapeutic exercise;Neuromuscular re-education;Patient/family education;Passive range of motion;Manual techniques;Vasopneumatic Device    PT Next Visit Plan  Focus on eccentric exercises and introduce weight bearing and balance activities as tolerated.    Consulted and Agree with Plan of Care  Patient       Patient will benefit from skilled therapeutic intervention in order to improve the following deficits and impairments:  Pain, Decreased activity tolerance, Decreased range of motion, Decreased strength  Visit Diagnosis: Pain in right ankle and joints of right foot  Stiffness of right ankle, not elsewhere classified  Muscle weakness (generalized)     Problem List Patient Active Problem List   Diagnosis Date Noted  . Posterior tibial tendinitis, right leg 08/16/2017   Guss Bunde, PT, DPT 08/30/2017, 5:28 PM  [facility] [address] [place], [place], [address] Phone: [phone]   Fax:  [phone]  Name: Shannon Moon MRN: [id number] Date of Birth: Nov 13, 1985

## 2017-09-04 ENCOUNTER — Ambulatory Visit: Payer: BLUE CROSS/BLUE SHIELD | Admitting: Physical Therapy

## 2017-09-04 DIAGNOSIS — M25571 Pain in right ankle and joints of right foot: Secondary | ICD-10-CM | POA: Diagnosis not present

## 2017-09-04 DIAGNOSIS — M6281 Muscle weakness (generalized): Secondary | ICD-10-CM | POA: Diagnosis not present

## 2017-09-04 DIAGNOSIS — M25671 Stiffness of right ankle, not elsewhere classified: Secondary | ICD-10-CM | POA: Diagnosis not present

## 2017-09-04 NOTE — Therapy (Addendum)
[facility] [address] [place], [place], [address] Phone: [phone]   Fax:  [phone]  Physical Therapy Treatment  Patient Details  Name: Shannon Moon MRN: [id number] Date of Birth: 10/11/1985 Referring Provider: Mechele ClaudeWarren Stacks MD   Encounter Date: 09/04/2017  PT End of Session - 09/04/17 1614    Visit Number  7    Number of Visits  12    Date for PT Re-Evaluation  09/26/17    PT Start Time  1612    PT Stop Time  1659    PT Time Calculation (min)  47 min    Activity Tolerance  Patient tolerated treatment well;Other (comment)    Behavior During Therapy  WFL for tasks assessed/performed     ADDENDUM:  PT Stop time: 1703 PT Time Calculation (min): 51 min   Past Medical History:  Diagnosis Date  . Pregnancy     Past Surgical History:  Procedure Laterality Date  . ADENOIDECTOMY    . CESAREAN SECTION    . TONSILLECTOMY      There were no vitals filed for this visit.  Subjective Assessment - 09/04/17 1615    Subjective  Patient reported "I'm feeling good. I feel I've gotten used to the insole and if I start to feel pain and start limping, I stop and rest" Patient reports 4/10 pain. Patient also stated today is the first day she was able to work a full day with the insole.    Pertinent History  Right ankle sprain 4/18.    Patient Stated Goals  Walk without pain.    Currently in Pain?  Yes    Pain Score  4     Pain Location  Ankle    Pain Orientation  Right    Pain Descriptors / Indicators  Sore                      [facility] Adult PT Treatment/Exercise - 09/04/17 0001      Exercises   Exercises  Ankle      Knee/Hip Exercises: Aerobic   Stationary Bike  L3 x10 mins      Electrical Stimulation   Electrical Stimulation Location  Right Medial Ankle    Electrical Stimulation Action  PreMod    Electrical Stimulation Parameters  80-150 hz x10 min    Electrical Stimulation Goals  Pain      Vasopneumatic    Number Minutes Vasopneumatic   10 minutes    Vasopnuematic Location   Ankle    Vasopneumatic Pressure  Low    Vasopneumatic Temperature   34      Ankle Exercises: Standing   SLS  Right SLS with one UE support 3x30"    Heel Raises  15 reps eccentric control    Toe Raise  15 reps      Ankle Exercises: Seated   Heel Raises  20 reps eccentric control    BAPS  Sitting;Level 3;15 reps clockwise/counterclockwise & IN/EV    Other Seated Ankle Exercises  Seated red theraband eccentric inversion 4 minutes                  PT Long Term Goals - 08/30/17 1726      PT LONG TERM GOAL #1   Title  Independent with a HEP.    Time  6    Period  Weeks    Status  Achieved      PT LONG TERM GOAL #2  Title  Increase ankle dorsiflexion to 8 degrees to normalize the patient's gait pattern.    Baseline  Right ankle dorsiflexion AROM 8 degrees    Time  6    Period  Weeks    Status  Achieved      PT LONG TERM GOAL #3   Title  Increase right ankle strength to 4+ to 5/5 to increase stability for functional tasks.    Time  6    Period  Weeks    Status  On-going      PT LONG TERM GOAL #4   Title  Walk a community distance with pain not > 2/10.    Time  6    Period  Weeks    Status  On-going            Plan - 09/04/17 1748    Clinical Impression Statement  Patient was able to complete exercises without any increase of pain, just soreness. Patient noted with increased sway with SLS requiring 1 UE extremity support PRN to maintain balance. Patient's gait has improved as noted by increased R stance time and improved heel to toe off. No adverse affects noted upon removal of modalities.    Rehab Potential  Excellent    PT Frequency  2x / week    PT Duration  6 weeks    PT Treatment/Interventions  ADLs/Self Care Home Management;Cryotherapy;Clinical cytogeneticist;Therapeutic activities;Therapeutic exercise;Neuromuscular re-education;Patient/family education;Passive range  of motion;Manual techniques;Vasopneumatic Device    PT Next Visit Plan  Continue POC to normalize gait and improve function.    Consulted and Agree with Plan of Care  Patient       Patient will benefit from skilled therapeutic intervention in order to improve the following deficits and impairments:  Pain, Decreased activity tolerance, Decreased range of motion, Decreased strength  Visit Diagnosis: Pain in right ankle and joints of right foot  Stiffness of right ankle, not elsewhere classified  Muscle weakness (generalized)     Problem List Patient Active Problem List   Diagnosis Date Noted  . Posterior tibial tendinitis, right leg 08/16/2017   Guss Bunde, PT, DPT 09/04/2017, 5:57 PM   Guss Bunde, PT, DPT 09/10/2017, 9:09 PM  [facility] [address] [place], [place], [address] Phone: [phone]   Fax:  [phone]  Name: Shannon Moon MRN: [id number] Date of Birth: 1985/09/14

## 2017-09-06 ENCOUNTER — Encounter: Payer: Self-pay | Admitting: Physical Therapy

## 2017-09-06 ENCOUNTER — Ambulatory Visit: Payer: BLUE CROSS/BLUE SHIELD | Admitting: Physical Therapy

## 2017-09-06 DIAGNOSIS — M6281 Muscle weakness (generalized): Secondary | ICD-10-CM | POA: Diagnosis not present

## 2017-09-06 DIAGNOSIS — M25671 Stiffness of right ankle, not elsewhere classified: Secondary | ICD-10-CM

## 2017-09-06 DIAGNOSIS — M25571 Pain in right ankle and joints of right foot: Secondary | ICD-10-CM | POA: Diagnosis not present

## 2017-09-06 NOTE — Therapy (Signed)
[facility] [address] [place], [place], [address] Phone: [phone]   Fax:  [phone]  Physical Therapy Treatment  Patient Details  Name: Shannon Moon MRN: [id number] Date of Birth: 04-Oct-1985 Referring Provider: Mechele Claude MD   Encounter Date: 09/06/2017  PT End of Session - 09/06/17 1649    Visit Number  8    Number of Visits  12    Date for PT Re-Evaluation  09/26/17    PT Start Time  0400    PT Stop Time  0451    PT Time Calculation (min)  51 min       Past Medical History:  Diagnosis Date  . Pregnancy     Past Surgical History:  Procedure Laterality Date  . ADENOIDECTOMY    . CESAREAN SECTION    . TONSILLECTOMY      There were no vitals filed for this visit.  Subjective Assessment - 09/06/17 1653    Subjective  Pain not bad today.    Patient Stated Goals  Walk without pain.    Currently in Pain?  Yes    Pain Score  3     Pain Orientation  Right    Pain Descriptors / Indicators  Sore    Pain Onset  1 to 4 weeks ago     TREATMENT:  Stationary bike x 15 minutes f/b rebounder with SLR on right with 2# ball x 4 minutes f/b inverted BOSU right SLS with UE support as needed f/b Pre-mod to RT post tib region and medium vasopneumatic x 20 minutes.  No pain reported after tretament.                 [facility] Adult PT Treatment/Exercise - 09/06/17 0001      Exercises   Exercises  Knee/Hip;Ankle      Knee/Hip Exercises: Aerobic   Stationary Bike  level 3 x 15 minutes.      Knee/Hip Exercises: Standing   Other Standing Knee Exercises  Rebonder with right SLS with 2# ball x 4 minutes.    Other Standing Knee Exercises  Standing with UE support as needed x 4 minutes on inverted BOSU ball x 4 minutes.                  PT Long Term Goals - 08/30/17 1726      PT LONG TERM GOAL #1   Title  Independent with a HEP.    Time  6    Period  Weeks    Status  Achieved      PT LONG TERM GOAL #2    Title  Increase ankle dorsiflexion to 8 degrees to normalize the patient's gait pattern.    Baseline  Right ankle dorsiflexion AROM 8 degrees    Time  6    Period  Weeks    Status  Achieved      PT LONG TERM GOAL #3   Title  Increase right ankle strength to 4+ to 5/5 to increase stability for functional tasks.    Time  6    Period  Weeks    Status  On-going      PT LONG TERM GOAL #4   Title  Walk a community distance with pain not > 2/10.    Time  6    Period  Weeks    Status  On-going            Plan - 09/06/17 1658  Clinical Impression Statement  The patient did excellent with no pain reported after treatment.    PT Treatment/Interventions  ADLs/Self Care Home Management;Cryotherapy;Clinical cytogeneticistlectrical Stimulation;Gait training;Therapeutic activities;Therapeutic exercise;Neuromuscular re-education;Patient/family education;Passive range of motion;Manual techniques;Vasopneumatic Device    Consulted and Agree with Plan of Care  Patient       Patient will benefit from skilled therapeutic intervention in order to improve the following deficits and impairments:  Pain, Decreased activity tolerance, Decreased range of motion, Decreased strength  Visit Diagnosis: Pain in right ankle and joints of right foot  Stiffness of right ankle, not elsewhere classified  Muscle weakness (generalized)     Problem List Patient Active Problem List   Diagnosis Date Noted  . Posterior tibial tendinitis, right leg 08/16/2017    Aladdin Kollmann, [place] MPT 09/06/2017, 4:59 PM  [facility] [address] [place], [place], [address] Phone: [phone]   Fax:  [phone]  Name: Shannon Moon MRN: [id number] Date of Birth: 12/26/1985

## 2017-09-11 ENCOUNTER — Encounter: Payer: Self-pay | Admitting: Physical Therapy

## 2017-09-11 ENCOUNTER — Ambulatory Visit: Payer: BLUE CROSS/BLUE SHIELD | Admitting: Physical Therapy

## 2017-09-11 DIAGNOSIS — M6281 Muscle weakness (generalized): Secondary | ICD-10-CM

## 2017-09-11 DIAGNOSIS — M25571 Pain in right ankle and joints of right foot: Secondary | ICD-10-CM | POA: Diagnosis not present

## 2017-09-11 DIAGNOSIS — M25671 Stiffness of right ankle, not elsewhere classified: Secondary | ICD-10-CM | POA: Diagnosis not present

## 2017-09-11 NOTE — Therapy (Signed)
[facility] [address] [place], [place], [address] Phone: [phone]   Fax:  [phone]  Physical Therapy Treatment  Patient Details  Name: Shannon Moon MRN: [id number] Date of Birth: 01-16-1986 Referring Provider: Mechele Claude MD   Encounter Date: 09/11/2017  PT End of Session - 09/11/17 0818    Visit Number  9    Number of Visits  12    Date for PT Re-Evaluation  09/26/17    PT Start Time  0816    PT Stop Time  0904    PT Time Calculation (min)  48 min    Activity Tolerance  Patient tolerated treatment well    Behavior During Therapy  [facility] for tasks assessed/performed       Past Medical History:  Diagnosis Date  . Pregnancy     Past Surgical History:  Procedure Laterality Date  . ADENOIDECTOMY    . CESAREAN SECTION    . TONSILLECTOMY      There were no vitals filed for this visit.  Subjective Assessment - 09/11/17 0817    Subjective  Reports that her foot is good this morning as she has been out of work for two days.    Pertinent History  Right ankle sprain 4/18.    Patient Stated Goals  Walk without pain.    Currently in Pain?  No/denies         [facility] PT Assessment - 09/11/17 0001      Assessment   Medical Diagnosis  Tendonitis of right foot.    Next MD Visit  09/27/2017      Precautions   Precautions  None      Restrictions   Weight Bearing Restrictions  No                  [facility] Adult PT Treatment/Exercise - 09/11/17 0001      Modalities   Modalities  Electrical Stimulation;Vasopneumatic      Electrical Stimulation   Electrical Stimulation Location  R medial ankle    Electrical Stimulation Action  Pre-Mod    Electrical Stimulation Parameters  80-150 hz x15 min    Electrical Stimulation Goals  Pain      Vasopneumatic   Number Minutes Vasopneumatic   15 minutes    Vasopnuematic Location   Ankle    Vasopneumatic Pressure  Medium    Vasopneumatic Temperature   34      Ankle  Exercises: Aerobic   Stationary Bike  L3 x12 min      Ankle Exercises: Machines for Strengthening   Cybex Leg Press  2 pl, seat 6 x20 reps      Ankle Exercises: Standing   SLS  RLE SLS for cone pickup x2 reps and balance pods x2 min    Heel Raises  20 reps;Other (comment) 3D    Toe Raise  20 reps                  PT Long Term Goals - 08/30/17 1726      PT LONG TERM GOAL #1   Title  Independent with a HEP.    Time  6    Period  Weeks    Status  Achieved      PT LONG TERM GOAL #2   Title  Increase ankle dorsiflexion to 8 degrees to normalize the patient's gait pattern.    Baseline  Right ankle dorsiflexion AROM 8 degrees    Time  6  Period  Weeks    Status  Achieved      PT LONG TERM GOAL #3   Title  Increase right ankle strength to 4+ to 5/5 to increase stability for functional tasks.    Time  6    Period  Weeks    Status  On-going      PT LONG TERM GOAL #4   Title  Walk a community distance with pain not > 2/10.    Time  6    Period  Weeks    Status  On-going            Plan - 09/11/17 0854    Clinical Impression Statement  Patient tolerated today's treatment fairly well as she reported no pain upon arrival in clinic. Patient able to complete exercises as directed with reports of tolerable R medial ankle pain. Patient completed treatment with insert donned. Patient required intermittant rest break with oscillation of R foot to reduce throbbing sensation that presents with activity. Normal modalities response noted following removal of the modalities.    Rehab Potential  Excellent    PT Frequency  2x / week    PT Duration  6 weeks    PT Treatment/Interventions  ADLs/Self Care Home Management;Cryotherapy;Clinical cytogeneticistlectrical Stimulation;Gait training;Therapeutic activities;Therapeutic exercise;Neuromuscular re-education;Patient/family education;Passive range of motion;Manual techniques;Vasopneumatic Device    PT Next Visit Plan  Continue POC to normalize gait and  improve function.    Consulted and Agree with Plan of Care  Patient       Patient will benefit from skilled therapeutic intervention in order to improve the following deficits and impairments:  Pain, Decreased activity tolerance, Decreased range of motion, Decreased strength  Visit Diagnosis: Pain in right ankle and joints of right foot  Stiffness of right ankle, not elsewhere classified  Muscle weakness (generalized)     Problem List Patient Active Problem List   Diagnosis Date Noted  . Posterior tibial tendinitis, right leg 08/16/2017    Marvell FullerKelsey P Oaklie Durrett, PTA 09/11/2017, 9:35 AM  [facility] [address] [place], [place], [address] Phone: [phone]   Fax:  [phone]  Name: Shannon Moon MRN: [id number] Date of Birth: 03/08/1986

## 2017-09-13 ENCOUNTER — Ambulatory Visit: Payer: BLUE CROSS/BLUE SHIELD | Admitting: Physical Therapy

## 2017-09-13 DIAGNOSIS — M25571 Pain in right ankle and joints of right foot: Secondary | ICD-10-CM

## 2017-09-13 DIAGNOSIS — M25671 Stiffness of right ankle, not elsewhere classified: Secondary | ICD-10-CM

## 2017-09-13 DIAGNOSIS — M6281 Muscle weakness (generalized): Secondary | ICD-10-CM | POA: Diagnosis not present

## 2017-09-13 NOTE — Therapy (Signed)
[facility] [address] [place], [place], [address] Phone: [phone]   Fax:  [phone]  Physical Therapy Treatment  Patient Details  Name: Shannon HowellsDevona Y Rao MRN: [id number] Date of Birth: 04/15/1986 Referring Provider: Mechele ClaudeWarren Stacks MD   Encounter Date: 09/13/2017  PT End of Session - 09/13/17 1604    Visit Number  10    Number of Visits  12    Date for PT Re-Evaluation  09/26/17    PT Start Time  1600    PT Stop Time  1648    PT Time Calculation (min)  48 min    Activity Tolerance  Patient tolerated treatment well    Behavior During Therapy  [facility] for tasks assessed/performed       Past Medical History:  Diagnosis Date  . Pregnancy     Past Surgical History:  Procedure Laterality Date  . ADENOIDECTOMY    . CESAREAN SECTION    . TONSILLECTOMY      There were no vitals filed for this visit.  Subjective Assessment - 09/13/17 1605    Subjective  Patient reported her foot is feeling much better since the injury. Patient reports no pain currently.    Currently in Pain?  No/denies                      [facility] - 09/13/17 0001      Exercises   Exercises  Ankle;Knee/Hip      Knee/Hip Exercises: Aerobic   Stationary Bike  level 5 x5 minutes    Elliptical  x5 minutes      Knee/Hip Exercises: Standing   Step Down  Right;Hand Hold: 2;20 reps;Step Height: 6"    Functional Squat  2 sets;10 reps on foam    Lunge Walking - Round Trips  lunge walking in tiled hal way x2    Rocker Board  3 minutes DF/ PF & IN/EV 3x30"      Modalities   Modalities  Primary school teacherlectrical Stimulation      Electrical Stimulation   Electrical Stimulation Location  R medial ankle    Electrical Stimulation Action  pre-mod    Electrical Stimulation Parameters  80-150 hz x15 min    Electrical Stimulation Goals  Pain      Vasopneumatic   Number Minutes Vasopneumatic   15 minutes    Vasopnuematic Location   Ankle    Vasopneumatic Pressure  Medium    Vasopneumatic Temperature   34                  PT Long Term Goals - 08/30/17 1726      PT LONG TERM GOAL #1   Title  Independent with a HEP.    Time  6    Period  Weeks    Status  Achieved      PT LONG TERM GOAL #2   Title  Increase ankle dorsiflexion to 8 degrees to normalize the patient's gait pattern.    Baseline  Right ankle dorsiflexion AROM 8 degrees    Time  6    Period  Weeks    Status  Achieved      PT LONG TERM GOAL #3   Title  Increase right ankle strength to 4+ to 5/5 to increase stability for functional tasks.    Time  6    Period  Weeks    Status  On-going      PT LONG TERM GOAL #4  Title  Walk a community distance with pain not > 2/10.    Time  6    Period  Weeks    Status  On-going            Plan - 09/13/17 1657    Clinical Impression Statement  Patient was able to complete exercises with no increase of pain or discomfort. Patient has overall improvement of gait, equalized weight distribution in static standing and balance. Patient required verbal and tactile cuing for appropriate squat form. Patient able to demo proper form 75% of the time without cuing. No noted adverse affects upon removal of modalities.    Clinical Presentation  Stable    Clinical Decision Making  Low    Rehab Potential  Excellent    PT Frequency  2x / week    PT Duration  6 weeks    PT Treatment/Interventions  ADLs/Self Care Home Management;Cryotherapy;Clinical cytogeneticist;Therapeutic activities;Therapeutic exercise;Neuromuscular re-education;Patient/family education;Passive range of motion;Manual techniques;Vasopneumatic Device    PT Next Visit Plan  Continue POC to normalize gait and improve function. Progress to high level balance activities.    Consulted and Agree with Plan of Care  Patient       Patient will benefit from skilled therapeutic intervention in order to improve the following deficits and  impairments:  Pain, Decreased activity tolerance, Decreased range of motion, Decreased strength  Visit Diagnosis: Pain in right ankle and joints of right foot  Stiffness of right ankle, not elsewhere classified  Muscle weakness (generalized)     Problem List Patient Active Problem List   Diagnosis Date Noted  . Posterior tibial tendinitis, right leg 08/16/2017   Guss Bunde, PT, DPT 09/13/2017, 6:09 PM  [facility] [address] [place], [place], [address] Phone: [phone]   Fax:  [phone]  Name: MARYLEN ZUK MRN: [id number] Date of Birth: [birth year]

## 2017-09-19 ENCOUNTER — Ambulatory Visit: Payer: BLUE CROSS/BLUE SHIELD | Attending: Orthopaedic Surgery | Admitting: Physical Therapy

## 2017-09-19 ENCOUNTER — Encounter: Payer: Self-pay | Admitting: Physical Therapy

## 2017-09-19 DIAGNOSIS — M6281 Muscle weakness (generalized): Secondary | ICD-10-CM | POA: Diagnosis not present

## 2017-09-19 DIAGNOSIS — M25671 Stiffness of right ankle, not elsewhere classified: Secondary | ICD-10-CM

## 2017-09-19 DIAGNOSIS — M25571 Pain in right ankle and joints of right foot: Secondary | ICD-10-CM | POA: Insufficient documentation

## 2017-09-19 NOTE — Therapy (Signed)
[facility] [place], [place], [address] Phone: [phone]   Fax:  [phone]  Physical Therapy Treatment  Patient Details  Name: Shannon Moon MRN: [id number] Date of Birth: March 06, 1986 Referring Provider: Claretta Fraise MD   Encounter Date: 09/19/2017  PT End of Session - 09/19/17 0905    Visit Number  11    Number of Visits  12    Date for PT Re-Evaluation  09/26/17    PT Start Time  0902    PT Stop Time  0947    PT Time Calculation (min)  45 min    Activity Tolerance  Patient tolerated treatment well    Behavior During Therapy  [facility] for tasks assessed/performed       Past Medical History:  Diagnosis Date  . Pregnancy     Past Surgical History:  Procedure Laterality Date  . ADENOIDECTOMY    . CESAREAN SECTION    . TONSILLECTOMY      There were no vitals filed for this visit.  Subjective Assessment - 09/19/17 0904    Subjective  Reports that her ankle is good but has more trouble as she is working 9-10 hour days.    Pertinent History  Right ankle sprain 4/18.    Patient Stated Goals  Walk without pain.    Currently in Pain?  No/denies         [facility] PT Assessment - 09/19/17 0001      Assessment   Medical Diagnosis  Tendonitis of right foot.    Next MD Visit  09/27/2017      Precautions   Precautions  None      Restrictions   Weight Bearing Restrictions  No                  [facility] Adult PT Treatment/Exercise - 09/19/17 0001      Modalities   Modalities  Vasopneumatic      Vasopneumatic   Number Minutes Vasopneumatic   10 minutes    Vasopnuematic Location   Ankle    Vasopneumatic Pressure  Medium    Vasopneumatic Temperature   34      Ankle Exercises: Sidelying   Ankle Inversion  Strengthening;Right;Other reps (comment);Weights x30 reps     Ankle Inversion Weights (lbs)  1    Ankle Eversion  Strengthening;Right;Other reps (comment);Weights x30 reps    Ankle Eversion Weights  (lbs)  1      Ankle Exercises: Aerobic   Stationary Bike  L3 x10 min      Ankle Exercises: Machines for Strengthening   Cybex Leg Press  2.5 pl, seat 6 x20 reps 2.5 pl x15 reps for resisted heel raise      Ankle Exercises: Standing   SLS  RLE SLS for cone pickup x2 reps and RLE SLS on airex x2 min                  PT Long Term Goals - 09/19/17 0932      PT LONG TERM GOAL #1   Title  Independent with a HEP.    Time  6    Period  Weeks    Status  Achieved      PT LONG TERM GOAL #2   Title  Increase ankle dorsiflexion to 8 degrees to normalize the patient's gait pattern.    Baseline  Right ankle dorsiflexion AROM 8 degrees    Time  6    Period  Weeks  Status  Achieved      PT LONG TERM GOAL #3   Title  Increase right ankle strength to 4+ to 5/5 to increase stability for functional tasks.    Time  6    Period  Weeks    Status  On-going      PT LONG TERM GOAL #4   Title  Walk a community distance with pain not > 2/10.    Time  6    Period  Weeks    Status  Partially Met Achieved for 1 trip around store but multiple laps cause cause 09/19/2017            Plan - 09/19/17 0935    Clinical Impression Statement  Patient tolerated today's treatment well as she arrived without pain and is able to tolerate standing longer but lately longer hours at work has caused pain. 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 Patient compliant with orthotic use at this time. Patient guided through more advanced strengthening exercises with only complaint of "feeling it" but "not pain" at distal attachment of R posterior tibialis with anke isolator inversion. Patient continues to require intermittant toe or foot touch of LLE or UE assist to regain balancce for SLS activities. Normal vasopneumatic response noted following removal of the modalities.    Rehab Potential  Excellent    PT  Frequency  2x / week    PT Duration  6 weeks    PT Treatment/Interventions  ADLs/Self Care Home Management;Cryotherapy;Advice worker;Therapeutic activities;Therapeutic exercise;Neuromuscular re-education;Patient/family education;Passive range of motion;Manual techniques;Vasopneumatic Device    PT Next Visit Plan  Continue POC to normalize gait and improve function. Progress to high level balance activities.    Consulted and Agree with Plan of Care  Patient       Patient will benefit from skilled therapeutic intervention in order to improve the following deficits and impairments:  Pain, Decreased activity tolerance, Decreased range of motion, Decreased strength  Visit Diagnosis: Pain in right ankle and joints of right foot  Stiffness of right ankle, not elsewhere classified  Muscle weakness (generalized)     Problem List Patient Active Problem List   Diagnosis Date Noted  . Posterior tibial tendinitis, right leg 08/16/2017    Standley Brooking, PTA 09/19/2017, 9:52 AM  [facility] [place], [place], [address] Phone: [phone]   Fax:  [phone]  Name: Shannon Moon MRN: [id number] Date of Birth: Dec 12, 1985

## 2017-09-20 ENCOUNTER — Encounter: Payer: Self-pay | Admitting: *Deleted

## 2017-09-20 ENCOUNTER — Ambulatory Visit: Payer: BLUE CROSS/BLUE SHIELD | Admitting: *Deleted

## 2017-09-20 DIAGNOSIS — M25671 Stiffness of right ankle, not elsewhere classified: Secondary | ICD-10-CM

## 2017-09-20 DIAGNOSIS — M25571 Pain in right ankle and joints of right foot: Secondary | ICD-10-CM

## 2017-09-20 DIAGNOSIS — M6281 Muscle weakness (generalized): Secondary | ICD-10-CM | POA: Diagnosis not present

## 2017-09-20 NOTE — Therapy (Addendum)
[facility] [place], [place], [address] Phone: [phone]   Fax:  [phone]  Physical Therapy Treatment/Discharge  Patient Details  Name: Shannon Moon MRN: [id number] Date of Birth: 1986/05/29 Referring Provider: Claretta Fraise MD   Encounter Date: 09/20/2017  PT End of Session - 09/20/17 0909    Visit Number  12    Number of Visits  12    Date for PT Re-Evaluation  09/26/17    PT Start Time  0900    PT Stop Time  0959    PT Time Calculation (min)  59 min       Past Medical History:  Diagnosis Date  . Pregnancy     Past Surgical History:  Procedure Laterality Date  . ADENOIDECTOMY    . CESAREAN SECTION    . TONSILLECTOMY      There were no vitals filed for this visit.  Subjective Assessment - 09/20/17 0907    Subjective  Reports that her ankle is good but has more trouble as she is working 9-10 hour days.  To MD on 09-27-17    Pertinent History  Right ankle sprain 4/18.    Patient Stated Goals  Walk without pain.    Currently in Pain?  No/denies    Pain Score  3  3/10 after 8 hrs of standing    Pain Location  Ankle    Pain Orientation  Right    Pain Descriptors / Indicators  Sore    Pain Onset  1 to 4 weeks ago    Pain Frequency  Constant                      [facility] Adult PT Treatment/Exercise - 09/20/17 0001      Exercises   Exercises  Ankle;Knee/Hip      Knee/Hip Exercises: Aerobic   Stationary Bike  level 5 x10 minutes      Knee/Hip Exercises: Standing   Heel Raises  Right;Both;20 reps and toe raises x20      Modalities   Modalities  Vasopneumatic      Electrical Stimulation   Electrical Stimulation Location  R medial ankle premod x 15 mins 80-_0     Electrical Stimulation Goals  Pain      Vasopneumatic   Number Minutes Vasopneumatic   15 minutes    Vasopnuematic Location   Ankle    Vasopneumatic Pressure  Medium    Vasopneumatic Temperature   34      Ankle  Exercises: Standing   SLS  RLE SLS     Heel Raises  20 reps;Other (comment)    Toe Raise  20 reps    Other Standing Ankle Exercises  Dyna disc all motions and SLS      Ankle Exercises: Sidelying   Ankle Inversion  Strengthening;Right;Other reps (comment);Theraband x30 reps     Ankle Eversion  Strengthening;Right;Other reps (comment);Theraband x30 reps                  PT Long Term Goals - 09/20/17 0910      PT LONG TERM GOAL #1   Title  Independent with a HEP.    Time  6    Period  Weeks    Status  Achieved      PT LONG TERM GOAL #2   Baseline  Right ankle dorsiflexion AROM 8 degrees    Time  6    Period  Weeks    Status  Achieved      PT LONG TERM GOAL #3   Title  Increase right ankle strength to 4+ to 5/5 to increase stability for functional tasks.    Baseline  NM 4/5    Time  6    Period  Weeks    Status  Not Met      PT LONG TERM GOAL #4   Title  Walk a community distance with pain not > 2/10.    Baseline  NM pain3-6/10    Period  Weeks    Status  Not Met            Plan - 09/20/17 1207    Clinical Impression Statement  Pt arrived today doing fairly well and feels that PT has helped a lot, but continues to have some weakness and is unable to walk prolonged periods without pain increase. She currently has orthotics that are helping and is Independent with HEP for prooriception and strengthening THerex. Normal response to modalities today.    Clinical Presentation  Stable    Clinical Decision Making  Low    Rehab Potential  Excellent    PT Frequency  2x / week    PT Treatment/Interventions  ADLs/Self Care Home Management;Cryotherapy;Advice worker;Therapeutic activities;Therapeutic exercise;Neuromuscular re-education;Patient/family education;Passive range of motion;Manual techniques;Vasopneumatic Device    PT Next Visit Plan  To MD next week. Proceed as per MD    Consulted and Agree with Plan of Care  Patient       Patient  will benefit from skilled therapeutic intervention in order to improve the following deficits and impairments:  Pain, Decreased activity tolerance, Decreased range of motion, Decreased strength  Visit Diagnosis: Pain in right ankle and joints of right foot  Stiffness of right ankle, not elsewhere classified  Muscle weakness (generalized)     Problem List Patient Active Problem List   Diagnosis Date Noted  . Posterior tibial tendinitis, right leg 08/16/2017   PHYSICAL THERAPY DISCHARGE SUMMARY  Visits from Start of Care: 12  Current functional level related to goals / functional outcomes: See above   Remaining deficits: Goals partially met; strength and pain with walking not met   Education / Equipment: HEP  Plan: Patient agrees to discharge.  Patient goals were partially met. Patient is being discharged due to not returning since the last visit.  ?????       Jevaughn Degollado,CHRIS, PTA 09/20/2017, 1:51 PM Mali Applegate MPT [facility] [address] [place], [place], [address] Phone: [phone]   Fax:  [phone]  Name: Shannon Moon MRN: [id number] Date of Birth: Feb 17, 1986

## 2017-09-27 ENCOUNTER — Encounter (INDEPENDENT_AMBULATORY_CARE_PROVIDER_SITE_OTHER): Payer: Self-pay | Admitting: Orthopaedic Surgery

## 2017-09-27 ENCOUNTER — Ambulatory Visit (INDEPENDENT_AMBULATORY_CARE_PROVIDER_SITE_OTHER): Payer: BLUE CROSS/BLUE SHIELD | Admitting: Orthopaedic Surgery

## 2017-09-27 DIAGNOSIS — M76821 Posterior tibial tendinitis, right leg: Secondary | ICD-10-CM

## 2017-09-27 NOTE — Progress Notes (Signed)
The patient has known severe posterior tibial tendinitis of her right foot and ankle with flatfoot deformity.  This is been diagnosed with clinical exam as well as an MRI confirming the diagnosis.  She has no evidence of posterior tibial tendon dysfunction but certainly tenosynovitis of it.  We sent her for physical therapy and she says that is helping quite a bit and she states she is doing better overall.  They have been working on her balance and strength as well.  On exam she can perform a straight toe raise on both sides but still seems to be weak on her right side.  Both feet have flat feet deformities.  At this point she will continue her therapy and will follow-up as needed since she is making good progress.  All questions concerns were answered and addressed.  If this becomes an issue at all she will let us know.

## 2018-01-23 ENCOUNTER — Ambulatory Visit: Payer: BLUE CROSS/BLUE SHIELD | Admitting: Family Medicine

## 2018-01-23 ENCOUNTER — Encounter: Payer: Self-pay | Admitting: Family Medicine

## 2018-01-23 VITALS — BP 122/86 | HR 101 | Temp 98.9°F | Ht 62.0 in | Wt 174.4 lb

## 2018-01-23 DIAGNOSIS — J069 Acute upper respiratory infection, unspecified: Secondary | ICD-10-CM

## 2018-01-23 NOTE — Progress Notes (Signed)
   HPI  Patient presents today for illness.  Patient has cough, congestion, headache, and fever blister for 3 days.  She states the fever blister has popped and produced white fluid and has begun improving.  She was seen by the occupational nurse practitioner at work yesterday and started on a Prevpac and azithromycin.  Patient started both medications today and states that she feels a little bit better in general.  She is tolerating food and fluids like usual. Headache is improved. Fever blister improving. She reports that she had dullness in the right upper lung field as well as fluid in her left ear.  PMH: Smoking status noted ROS: Per HPI  Objective: BP 122/86   Pulse (!) 101   Temp 98.9 F (37.2 C) (Oral)   Ht 5\' 2"  (1.575 m)   Wt 174 lb 6.4 oz (79.1 kg)   SpO2 99%   BMI 31.90 kg/m  Gen: NAD, alert, cooperative with exam HEENT: NCAT, oropharynx moist and clear, no tenderness to palpation of the sinuses, effusion present bilaterally with erythema of the right TM CV: RRR, good S1/S2, no murmur Resp: Nonlabored, no wheezes, decreased air movement in the left lower lung field Abd: SNTND, BS present, no guarding or organomegaly Ext: No edema, warm Neuro: Alert and oriented, No gross deficits  Assessment and plan:  #Upper respiratory infection Continue azithromycin and prednisone as prescribed Discussed supportive care usual course of illness Okay with 2 days of work out if needed.    Murtis SinkSam Bradshaw, MD [facility] 01/23/2018, 11:04 AM

## 2018-04-04 ENCOUNTER — Encounter: Payer: Self-pay | Admitting: Family Medicine

## 2018-04-04 ENCOUNTER — Ambulatory Visit: Payer: BLUE CROSS/BLUE SHIELD | Admitting: Family Medicine

## 2018-04-04 VITALS — BP 108/77 | HR 87 | Temp 98.8°F | Ht 62.0 in | Wt 176.0 lb

## 2018-04-04 DIAGNOSIS — S838X2A Sprain of other specified parts of left knee, initial encounter: Secondary | ICD-10-CM

## 2018-04-04 NOTE — Progress Notes (Signed)
BP 108/77   Pulse 87   Temp 98.8 F (37.1 C)   Ht 5\' 2"  (1.575 m)   Wt 176 lb (79.8 kg)   BMI 32.19 kg/m    Subjective:    Patient ID: Shannon Moon, female    DOB: [birth year], 32 y.o.   MRN: [id number]  HPI: Shannon Moon is a 32 y.o. female presenting on 04/04/2018 for Knee Pain   HPI Left knee pain Patient has been complaining of left knee pain that has gotten worse over the past month but is really gotten worse over the past 3 days.  She thinks she may have sprained her left knee when she played basketball back in high school but did not have any issues until recently.  She has been working at [employer] on hard concrete floors.  She cannot recall any specific incident that brought this upon her but over the past month and especially over the past 3 days it has gotten significantly worse to where she is having it give out on her and it is popping and catching and she is noticed swelling.  She has not really taken anything over-the-counter for yet but has been icing it some which does help.  She denies any fevers or chills or redness or warmth.  She was is more concerned because is been happening more frequently.  Relevant past medical, surgical, family and social history reviewed and updated as indicated. Interim medical history since our last visit reviewed. Allergies and medications reviewed and updated.  Review of Systems  Constitutional: Negative for chills and fever.  Musculoskeletal: Positive for arthralgias and joint swelling. Negative for gait problem.  Skin: Negative for color change and rash.  Psychiatric/Behavioral: Negative for agitation and behavioral problems.  All other systems reviewed and are negative.   Per HPI unless specifically indicated above   Allergies as of 04/04/2018   No Known Allergies     Medication List        Accurate as of 04/04/18  1:41 PM. Always use your most recent med list.          azithromycin 250 MG tablet Commonly  known as:  ZITHROMAX   etonogestrel 68 MG Impl implant Commonly known as:  NEXPLANON 68 mg.   predniSONE 10 MG (21) Tbpk tablet Commonly known as:  STERAPRED UNI-PAK 21 TAB          Objective:    BP 108/77   Pulse 87   Temp 98.8 F (37.1 C)   Ht 5\' 2"  (1.575 m)   Wt 176 lb (79.8 kg)   BMI 32.19 kg/m   Wt Readings from Last 3 Encounters:  04/04/18 176 lb (79.8 kg)  01/23/18 174 lb 6.4 oz (79.1 kg)  07/11/17 177 lb (80.3 kg)    Physical Exam  Constitutional: She is oriented to person, place, and time. She appears well-developed and well-nourished. No distress.  Eyes: Conjunctivae are normal.  Musculoskeletal: Normal range of motion.       Left knee: She exhibits effusion and abnormal meniscus. She exhibits normal range of motion, no deformity, normal alignment, no LCL laxity, normal patellar mobility, no bony tenderness and no MCL laxity. Tenderness found. Lateral joint line tenderness noted.  Neurological: She is alert and oriented to person, place, and time. Coordination normal.  Skin: Skin is warm and dry. No rash noted. She is not diaphoretic. No erythema.  Nursing note and vitals reviewed.       Assessment & Plan:  Problem List Items Addressed This Visit    None    Visit Diagnoses    Injury of meniscus of left knee, initial encounter    -  Primary   Symptoms sound like meniscus injury on the lateral aspect based on exam, will refer back to Dr. Magnus IvanBlackman who she has seen before for other issues   Relevant Orders   Ambulatory referral to Orthopedic Surgery      Patient did not want injection today, just wants to go see the orthopedic to possibly get it repaired, she will try using anti-inflammatories and ice to help reduce until she gets to the orthopedic  Follow up plan: Return if symptoms worsen or fail to improve.  Counseling provided for all of the vaccine components Orders Placed This Encounter  Procedures  . Ambulatory referral to Orthopedic Surgery      Arville CareJoshua Byrl Latin, MD [facility] 04/04/2018, 1:41 PM

## 2018-04-05 ENCOUNTER — Telehealth: Payer: Self-pay | Admitting: Family Medicine

## 2018-04-05 DIAGNOSIS — S838X2A Sprain of other specified parts of left knee, initial encounter: Secondary | ICD-10-CM

## 2018-04-05 NOTE — Telephone Encounter (Signed)
Pt aware rx ready for pick up. 

## 2018-04-05 NOTE — Telephone Encounter (Signed)
I printed out the prescription for her

## 2018-04-10 ENCOUNTER — Ambulatory Visit (INDEPENDENT_AMBULATORY_CARE_PROVIDER_SITE_OTHER): Payer: BLUE CROSS/BLUE SHIELD | Admitting: Orthopaedic Surgery

## 2018-04-10 ENCOUNTER — Ambulatory Visit (INDEPENDENT_AMBULATORY_CARE_PROVIDER_SITE_OTHER): Payer: BLUE CROSS/BLUE SHIELD

## 2018-04-10 ENCOUNTER — Encounter (INDEPENDENT_AMBULATORY_CARE_PROVIDER_SITE_OTHER): Payer: Self-pay | Admitting: Orthopaedic Surgery

## 2018-04-10 DIAGNOSIS — M25562 Pain in left knee: Secondary | ICD-10-CM | POA: Diagnosis not present

## 2018-04-10 DIAGNOSIS — G8929 Other chronic pain: Secondary | ICD-10-CM | POA: Diagnosis not present

## 2018-04-10 NOTE — Progress Notes (Signed)
Office Visit Note   Patient: Shannon Moon           Date of Birth: 02-Jul-1986           MRN: [id number] Visit Date: 04/10/2018              Requested by: Dettinger, Elige Radon, MD [address] [place], [place] [address] PCP: Mechele Claude, MD   Assessment & Plan: Visit Diagnoses:  1. Chronic pain of left knee     Plan: Based on my clinical exam and her primary care physician's clinical exam we are both concerned that she has a torn meniscus in her left knee..  Mechanical symptoms are quite significant and worrisome.  With that being said an MRI is medically warranted to rule out a meniscal tear of her left knee.  All questions and concerns were answered and addressed.  We will review this MRI of the when she comes back in 2 weeks and it has been done.  She will avoid pivoting activities and being down her knee in the interim.  I agree with her continuing her knee sleeve as well.  Follow-Up Instructions: Return in about 2 weeks (around 04/24/2018).   Orders:  Orders Placed This Encounter  Procedures  . XR Knee 1-2 Views Left   No orders of the defined types were placed in this encounter.     Procedures: No procedures performed   Clinical Data: No additional findings.   Subjective: Chief Complaint  Patient presents with  . Left Knee - Follow-up  Patient is a very pleasant 32 year old I am seeing for the first time.  She has been having worsening locking catching of her left knee for several years now.  It used to occur infrequently but now is gotten to where it occurs almost on a daily basis.  It hurts with hyperflexion of her knee.  It hurts laterally and medially.  She denies any injury but this is gotten where pivoting activities are worrisome for her.  She is tried a knee brace as well as activity modification.  She is been on anti-inflammatories as well.  Her primary care physician examined her knee and she had a significantly positive Murray sign in her knee.  There is  concerned about a meniscal tear.  HPI  Review of Systems She currently denies any headache, chest pain, shortness of breath, fever, chills, nausea, vomiting.  Objective: Vital Signs: There were no vitals taken for this visit.  Physical Exam She is alert and oriented x3 and in no acute distress Ortho Exam Examination of her left and right knees were performed in the office.  She does have a mild effusion of her left knee.  She is very sensitive with a positive Murray sign to the lateral side of her knee.  She hurts posteriorly as well.  She has significant pain when I try to put her left knee through flexion past 90 degrees.  Her Lockman's exam is negative Specialty Comments:  No specialty comments available.  Imaging: Xr Knee 1-2 Views Left  Result Date: 04/10/2018 2 views of the left knee show no malalignment or acute findings.  The joint space is well-maintained.    PMFS History: Patient Active Problem List   Diagnosis Date Noted  . Posterior tibial tendinitis, right leg 08/16/2017   Past Medical History:  Diagnosis Date  . Pregnancy     History reviewed. No pertinent family history.  Past Surgical History:  Procedure Laterality Date  .  ADENOIDECTOMY    . CESAREAN SECTION    . TONSILLECTOMY     Social History   Occupational History  . Not on file  Tobacco Use  . Smoking status: Never Smoker  . Smokeless tobacco: Never Used  Substance and Sexual Activity  . Alcohol use: No  . Drug use: No  . Sexual activity: Yes    Birth control/protection: None

## 2018-04-12 ENCOUNTER — Other Ambulatory Visit (INDEPENDENT_AMBULATORY_CARE_PROVIDER_SITE_OTHER): Payer: Self-pay

## 2018-04-12 DIAGNOSIS — M25562 Pain in left knee: Principal | ICD-10-CM

## 2018-04-12 DIAGNOSIS — G8929 Other chronic pain: Secondary | ICD-10-CM

## 2018-04-20 ENCOUNTER — Other Ambulatory Visit: Payer: BLUE CROSS/BLUE SHIELD

## 2018-04-23 ENCOUNTER — Ambulatory Visit
Admission: RE | Admit: 2018-04-23 | Discharge: 2018-04-23 | Disposition: A | Discharge status: Home / Self Care | Payer: BLUE CROSS/BLUE SHIELD | Source: Ambulatory Visit | Attending: Orthopaedic Surgery | Admitting: Orthopaedic Surgery

## 2018-04-23 DIAGNOSIS — M25562 Pain in left knee: Principal | ICD-10-CM

## 2018-04-23 DIAGNOSIS — M7989 Other specified soft tissue disorders: Secondary | ICD-10-CM | POA: Diagnosis not present

## 2018-04-23 DIAGNOSIS — G8929 Other chronic pain: Secondary | ICD-10-CM

## 2018-04-24 ENCOUNTER — Encounter (INDEPENDENT_AMBULATORY_CARE_PROVIDER_SITE_OTHER): Payer: Self-pay | Admitting: Orthopaedic Surgery

## 2018-04-24 ENCOUNTER — Ambulatory Visit (INDEPENDENT_AMBULATORY_CARE_PROVIDER_SITE_OTHER): Payer: BLUE CROSS/BLUE SHIELD | Admitting: Orthopaedic Surgery

## 2018-04-24 DIAGNOSIS — G8929 Other chronic pain: Secondary | ICD-10-CM

## 2018-04-24 DIAGNOSIS — M25562 Pain in left knee: Secondary | ICD-10-CM | POA: Diagnosis not present

## 2018-04-24 MED ORDER — METHYLPREDNISOLONE ACETATE 40 MG/ML IJ SUSP
40.0000 mg | INTRAMUSCULAR | Status: AC | PRN
  Administered 2018-04-24: 40 mg via INTRA_ARTICULAR

## 2018-04-24 MED ORDER — LIDOCAINE HCL 1 % IJ SOLN
3.0000 mL | INTRAMUSCULAR | Status: AC | PRN
  Administered 2018-04-24: 3 mL

## 2018-04-24 NOTE — Progress Notes (Signed)
Office Visit Note   Patient: Shannon Moon           Date of Birth: Dec 26, 1985           MRN: [id number] Visit Date: 04/24/2018              Requested by: Mechele Claude, MD [address] [place], [place] [address] PCP: Mechele Claude, MD   Assessment & Plan: Visit Diagnoses:  1. Chronic pain of left knee     Plan: I did independently review the MRI with her her left knee and gave her reassurance that all the structures of the knee are intact.  I do feel that she would benefit from formal physical therapy given the fact that her knee is weak to her and they can work on [employer] strengthening of the left knee and other modalities to get her knee feeling better and stronger.  Also offered a steroid injection she has not had one before and she is agreeable to this at this standpoint and tolerated it well.  All questions concerns were answered and addressed.  She will continue her knee sleeve.  We will see her back in 6 weeks and hopefully she will have made great progress between the steroid injection and outpatient physical therapy.  Follow-Up Instructions: Return in about 6 weeks (around 06/05/2018).   Orders:  Orders Placed This Encounter  Procedures  . Large Joint Inj   No orders of the defined types were placed in this encounter.     Procedures: Large Joint Inj: L knee on 04/24/2018 3:56 PM Indications: diagnostic evaluation and pain Details: 22 G 1.5 in needle, superolateral approach  Arthrogram: No  Medications: 3 mL lidocaine 1 %; 40 mg methylPREDNISolone acetate 40 MG/ML Outcome: tolerated well, no immediate complications Procedure, treatment alternatives, risks and benefits explained, specific risks discussed. Consent was given by the patient. Immediately prior to procedure a time out was called to verify the correct patient, procedure, equipment, support staff and site/side marked as required. Patient was prepped and draped in the usual sterile fashion.       Clinical  Data: No additional findings.   Subjective: Chief Complaint  Patient presents with  . Left Knee - Follow-up  The patient is here for follow-up after having an MRI of her left knee due to chronic knee pain with giving out locking catching.  She still has a lot of problems with her knees she does wear knee sleeve.  She still says it feels unstable to her.  She is never had an injection is not had any type of formal physical therapy.  HPI  Review of Systems She currently denies any headache, chest pain, shortness of breath, fever, chills, nausea, vomiting.  Objective: Vital Signs: There were no vitals taken for this visit.  Physical Exam She is alert and oriented x3 and in no acute distress Ortho Exam Examination of her left knee shows no ligamentous instability and full range of motion but this is globally tender and painful to her especially with extremes of flexion and extension.  Her patella does track slightly lateral. Specialty Comments:  No specialty comments available.  Imaging: Mr Knee Left W/o Contrast  Result Date: 04/24/2018 CLINICAL DATA:  Lateral knee pain and swelling for 1 month. No acute injury or prior relevant surgery. EXAM: MRI OF THE LEFT KNEE WITHOUT CONTRAST TECHNIQUE: Multiplanar, multisequence MR imaging of the knee was performed. No intravenous contrast was administered. COMPARISON:  Report only from left knee  MRI 06/18/2011. FINDINGS: MENISCI Medial meniscus:  Intact with normal morphology. Lateral meniscus:  Intact with normal morphology. LIGAMENTS Cruciates:  Intact. Collaterals:  Intact. CARTILAGE Patellofemoral: Possible mild chondromalacia of the lateral patellar facet, best seen on the coronal and sagittal images. There is no full-thickness chondral defect or subchondral cyst formation. The trochlear cartilage appears intact. Medial:  Preserved. Lateral: Appears preserved. Minimal subchondral cyst formation in the lateral tibial plateau. MISCELLANEOUS Joint:   No significant joint effusion. Popliteal Fossa: There is a tiny Baker's cyst. Edema and small ganglia are present along the posteromedial aspect of the distal femur, near the insertion of the adductor magnus tendon. Extensor Mechanism:  Intact. Bones:  No acute or significant extra-articular osseous findings. Other: No other significant periarticular soft tissue findings. The lateral soft tissues appear unremarkable. IMPRESSION: 1. No evidence of internal derangement or explanation for lateral knee pain/swelling. 2. Possible mild inflammation and small ganglia near the insertion of the adductor magnus tendon on the posteromedial distal femur. 3. Possible mild lateral patellar chondromalacia. 4. The menisci, cruciate and collateral ligaments appear normal. Electronically Signed   By: Carey Bullocks M.D.   On: 04/24/2018 10:10     PMFS History: Patient Active Problem List   Diagnosis Date Noted  . Posterior tibial tendinitis, right leg 08/16/2017   Past Medical History:  Diagnosis Date  . Pregnancy     History reviewed. No pertinent family history.  Past Surgical History:  Procedure Laterality Date  . ADENOIDECTOMY    . CESAREAN SECTION    . TONSILLECTOMY     Social History   Occupational History  . Not on file  Tobacco Use  . Smoking status: Never Smoker  . Smokeless tobacco: Never Used  Substance and Sexual Activity  . Alcohol use: No  . Drug use: No  . Sexual activity: Yes    Birth control/protection: None

## 2018-04-25 ENCOUNTER — Other Ambulatory Visit (INDEPENDENT_AMBULATORY_CARE_PROVIDER_SITE_OTHER): Payer: Self-pay

## 2018-04-25 DIAGNOSIS — M25562 Pain in left knee: Principal | ICD-10-CM

## 2018-04-25 DIAGNOSIS — G8929 Other chronic pain: Secondary | ICD-10-CM

## 2018-05-13 ENCOUNTER — Other Ambulatory Visit: Payer: Self-pay

## 2018-05-13 ENCOUNTER — Ambulatory Visit: Payer: BLUE CROSS/BLUE SHIELD | Attending: Orthopaedic Surgery | Admitting: Physical Therapy

## 2018-05-13 ENCOUNTER — Encounter: Payer: Self-pay | Admitting: Physical Therapy

## 2018-05-13 DIAGNOSIS — G8929 Other chronic pain: Secondary | ICD-10-CM | POA: Diagnosis not present

## 2018-05-13 DIAGNOSIS — M6281 Muscle weakness (generalized): Secondary | ICD-10-CM | POA: Insufficient documentation

## 2018-05-13 DIAGNOSIS — M25562 Pain in left knee: Secondary | ICD-10-CM | POA: Diagnosis not present

## 2018-05-13 DIAGNOSIS — Z6832 Body mass index (BMI) 32.0-32.9, adult: Secondary | ICD-10-CM | POA: Diagnosis not present

## 2018-05-13 DIAGNOSIS — Z01419 Encounter for gynecological examination (general) (routine) without abnormal findings: Secondary | ICD-10-CM | POA: Diagnosis not present

## 2018-05-13 DIAGNOSIS — Z975 Presence of (intrauterine) contraceptive device: Secondary | ICD-10-CM | POA: Diagnosis not present

## 2018-05-13 NOTE — Therapy (Signed)
[facility] [address] [place], [place], [address] Phone: [phone]   Fax:  [phone]  Physical Therapy Evaluation  Patient Details  Name: Shannon Moon MRN: [id number] Date of Birth: July 03, 1986 Referring Provider (PT): Doneen Poisson, MD   Encounter Date: 05/13/2018  PT End of Session - 05/13/18 0859    Visit Number  1    Number of Visits  8    Date for PT Re-Evaluation  06/17/18    PT Start Time  0819    PT Stop Time  0857    PT Time Calculation (min)  38 min    Activity Tolerance  Patient tolerated treatment well    Behavior During Therapy  [facility] for tasks assessed/performed       Past Medical History:  Diagnosis Date  . Pregnancy     Past Surgical History:  Procedure Laterality Date  . ADENOIDECTOMY    . CESAREAN SECTION    . TONSILLECTOMY      There were no vitals filed for this visit.   Subjective Assessment - 05/13/18 1518    Subjective  Patient arrives to physical therapy with reports of left posterior and lateral knee pain that began insidiously in August 2019. Patient reports pain with long distance walking and squatting. Patient reports pain does not impede performance of ADLs however it does cause discomfort during work activities. Patient reports intermittent "giving way" but denies any falls. Patient has been wearing neoprene knee sleeve brace during work and reports it helps support the knee but reports it does not stay in place. Patient reports pain at worst is 9/10 with increased walking distance and squatting and pain at best is 0/10 with rest, elevation, and ice. Patient's goals are to decrease pain, improve ability to perform work activities, improve strength, movement, and standing time.    Limitations  Standing;Walking;Other (comment)   squatting   Patient Stated Goals  Walk without pain and left knee giving way.    Currently in Pain?  Yes    Pain Score  4     Pain Location  Knee    Pain  Orientation  Left;Posterior;Lateral    Pain Descriptors / Indicators  Sore    Pain Type  Acute pain    Pain Onset  More than a month ago    Pain Frequency  Constant    Aggravating Factors   walking long distances, squatting    Pain Relieving Factors  Elevating with ice, brace    Effect of Pain on Daily Activities  none         [facility] PT Assessment - 05/13/18 0001      Assessment   Medical Diagnosis  Chronic right knee pain    Referring Provider (PT)  Doneen Poisson, MD    Onset Date/Surgical Date  --   August 2019   Next MD Visit  End of October 2019    Prior Therapy  not for knee      Precautions   Precautions  None      Restrictions   Weight Bearing Restrictions  No      Balance Screen   Has the patient fallen in the past 6 months  No    Has the patient had a decrease in activity level because of a fear of falling?   No    Is the patient reluctant to leave their home because of a fear of falling?   No      Home Environment  Living Environment  Private residence    Living Arrangements  Spouse/significant other;Children      Prior Function   Level of Independence  Independent      Observation/Other Assessments-Edema    Edema  Circumferential      Circumferential Edema   Circumferential - Right  38.5 cm at mid patella    Circumferential - Left   37.5 cm at mid patella      Sensation   Light Touch  Appears Intact      ROM / Strength   AROM / PROM / Strength  AROM;PROM;Strength      AROM   AROM Assessment Site  Knee    Right/Left Knee  Left    Left Knee Extension  0    Left Knee Flexion  88   (+) Pain in lateral knee     PROM   PROM Assessment Site  Knee    Right/Left Knee  Left    Left Knee Extension  0    Left Knee Flexion  117   (+) Pain in lateral knee     Strength   Strength Assessment Site  Knee;Hip    Right/Left Hip  Left    Left Hip Extension  4-/5    Left Hip ABduction  3+/5    Right/Left Knee  Left    Left Knee Flexion  3+/5     Left Knee Extension  3+/5      Palpation   Patella mobility  3/6 WNL    Palpation comment  tender to palpation to posterior left knee and to lateral joint line      Special Tests   Other special tests  (-) left knee ligamentous testing: Anterior drawer, posterior drawer, valgus and varus test at 0 degrees.      Transfers   Transfers  Independent with all Transfers      Ambulation/Gait   Gait Pattern  Within Functional Limits;Step-through pattern;Decreased stance time - left                Objective measurements completed on examination: See above findings.              PT Education - 05/13/18 1529    Education Details  Quad sets, SLR, supine clams with yellow theraband, heel raises    Person(s) Educated  Patient    Methods  Explanation;Demonstration;Handout    Comprehension  Verbalized understanding;Returned demonstration          PT Long Term Goals - 05/13/18 1531      PT LONG TERM GOAL #1   Title  Patient will be independent with HEP and progression    Time  4    Period  Weeks    Status  New      PT LONG TERM GOAL #2   Title  Patient will demonstrate full left knee AROM, 0-120+ degrees to improve ability to perform functional tasks.    Baseline       Time  4    Period  Weeks    Status  New      PT LONG TERM GOAL #3   Title  Patient will demonstrate 4+/5 or greater left knee MMT in all planes to improve stability during functional tasks.     Baseline       Time  4    Period  Weeks    Status  New      PT LONG TERM GOAL #4   Title  Patient  will report ability to walk long distances for work task with less than 4/10 left knee pain and no reports of knee buckling or giving way.    Baseline       Time  4    Period  Weeks    Status  New             Plan - 05/13/18 1530    Clinical Impression Statement  Patient is a 32 year old female who presents to physical therapy with left posterior and lateral knee pain. Patient demonstrated fair  plus left knee strength, fair plus right left abduction strength. Patient noted with decrease AROM and PROM with reports of pain at end range. Patient tender to palpation along left posterior fossa, lateral joint line, and distal end of ITB. Patient noted with increased adhesions to ITB but noted with (-) modified Ober's test. Patient noted with decreased left stance time but otherwise WNL gait pattern. Patient would benefit from skilled physical therapy to address deficits and address patient's goals.     Clinical Presentation  Stable    Clinical Decision Making  Low    Rehab Potential  Excellent    PT Frequency  2x / week    PT Duration  4 weeks    PT Treatment/Interventions  ADLs/Self Care Home Management;Cryotherapy;Clinical cytogeneticist;Therapeutic activities;Therapeutic exercise;Neuromuscular re-education;Patient/family education;Passive range of motion;Manual techniques;Vasopneumatic Device;Stair training;Moist Heat;Ultrasound;Iontophoresis 4mg /ml Dexamethasone;Dry needling;Taping;Balance training    PT Next Visit Plan  Bike, pain free quad strengthening, L hip/glute strengthening, modalities PRN for pain relief.    PT Home Exercise Plan  see pt education section    Consulted and Agree with Plan of Care  Patient       Patient will benefit from skilled therapeutic intervention in order to improve the following deficits and impairments:  Pain, Decreased activity tolerance, Decreased endurance, Decreased range of motion, Decreased strength, Increased edema  Visit Diagnosis: Chronic pain of left knee - Plan: PT plan of care cert/re-cert  Muscle weakness (generalized) - Plan: PT plan of care cert/re-cert     Problem List Patient Active Problem List   Diagnosis Date Noted  . Posterior tibial tendinitis, right leg 08/16/2017   Guss Bunde, PT, DPT 05/13/2018, 3:44 PM  [facility] [address] [place], [place],  [address] Phone: [phone]   Fax:  [phone]  Name: Shannon Moon MRN: [id number] Date of Birth: 30-Jun-1986

## 2018-05-16 ENCOUNTER — Ambulatory Visit: Payer: BLUE CROSS/BLUE SHIELD | Attending: Orthopaedic Surgery | Admitting: Physical Therapy

## 2018-05-16 ENCOUNTER — Encounter: Payer: Self-pay | Admitting: Physical Therapy

## 2018-05-16 DIAGNOSIS — M25562 Pain in left knee: Secondary | ICD-10-CM | POA: Insufficient documentation

## 2018-05-16 DIAGNOSIS — G8929 Other chronic pain: Secondary | ICD-10-CM | POA: Diagnosis not present

## 2018-05-16 DIAGNOSIS — M6281 Muscle weakness (generalized): Secondary | ICD-10-CM | POA: Insufficient documentation

## 2018-05-16 NOTE — Therapy (Signed)
[facility] [address] [place], [place], [address] Phone: [phone]   Fax:  [phone]  Physical Therapy Treatment  Patient Details  Name: Shannon Moon MRN: [id number] Date of Birth: Aug 09, 1986 Referring Provider (PT): Doneen Poisson, MD   Encounter Date: 05/16/2018  PT End of Session - 05/16/18 0738    Visit Number  2    Number of Visits  8    Date for PT Re-Evaluation  06/17/18    PT Start Time  0729    PT Stop Time  0817    PT Time Calculation (min)  48 min    Activity Tolerance  Patient tolerated treatment well    Behavior During Therapy  [facility] for tasks assessed/performed       Past Medical History:  Diagnosis Date  . Pregnancy     Past Surgical History:  Procedure Laterality Date  . ADENOIDECTOMY    . CESAREAN SECTION    . TONSILLECTOMY      There were no vitals filed for this visit.  Subjective Assessment - 05/16/18 0737    Subjective  Reports that her knee is good this morning but relates pain to her job.    Pertinent History  Right ankle sprain 4/18.    Limitations  Standing;Walking;Other (comment)    Patient Stated Goals  Walk without pain and left knee giving way.    Currently in Pain?  No/denies         [facility] PT Assessment - 05/16/18 0001      Assessment   Medical Diagnosis  Chronic right knee pain    Next MD Visit  End of October 2019    Prior Therapy  not for knee      Precautions   Precautions  None      Restrictions   Weight Bearing Restrictions  No                   [facility] Adult PT Treatment/Exercise - 05/16/18 0001      Exercises   Exercises  Knee/Hip      Knee/Hip Exercises: Aerobic   Stationary Bike  L2 x14 min      Knee/Hip Exercises: Standing   Terminal Knee Extension  Strengthening;Left;20 reps;Limitations   4/10 L knee pain reported   Terminal Knee Extension Limitations  Pink XTS      Knee/Hip Exercises: Seated   Long Arc Quad  Strengthening;Left;2  sets;10 reps;Weights    Long Arc Quad Weight  3 lbs.      Knee/Hip Exercises: Supine   Bridges with Beacher May  Strengthening;10 reps    Straight Leg Raises  Strengthening;Right;20 reps    Straight Leg Raise with External Rotation  Strengthening;Left;15 reps   VMO weakness noted due to difficulty     Modalities   Modalities  Vasopneumatic;Iontophoresis      Iontophoresis   Type of Iontophoresis  Dexamethasone    Location  L lateral knee    Dose  1.0 ml    Time  8      Vasopneumatic   Number Minutes Vasopneumatic   10 minutes    Vasopnuematic Location   Knee    Vasopneumatic Pressure  Low    Vasopneumatic Temperature   34             PT Education - 05/16/18 1005    Education provided  Yes    Education Details  Iontophoresis educational handout    Person(s) Educated  Patient  Methods  Handout;Explanation    Comprehension  Verbalized understanding          PT Long Term Goals - 05/13/18 1531      PT LONG TERM GOAL #1   Title  Patient will be independent with HEP and progression    Time  4    Period  Weeks    Status  New      PT LONG TERM GOAL #2   Title  Patient will demonstrate full left knee AROM, 0-120+ degrees to improve ability to perform functional tasks.    Baseline       Time  4    Period  Weeks    Status  New      PT LONG TERM GOAL #3   Title  Patient will demonstrate 4+/5 or greater left knee MMT in all planes to improve stability during functional tasks.     Baseline       Time  4    Period  Weeks    Status  New      PT LONG TERM GOAL #4   Title  Patient will report ability to walk long distances for work task with less than 4/10 left knee pain and no reports of knee buckling or giving way.    Baseline       Time  4    Period  Weeks    Status  New            Plan - 05/16/18 1000    Clinical Impression Statement  Patient tolerated today's treatment fairly well as she arrived with no L knee pain only intermittant reports  initially with lateral and posterior knee pain with work activities or walking. Low level strengthening completed with weakness noted in L quad and VMO with exercises and minimal extensor lag present. Patient educated regarding iontophoresis patch and rationale for it. Normal vasopneumatic response noted following removal of the modalities. Iontophoresis patch donned to L lateral knee with education to remove in 2 hours, any redness that may present and to report back to PT staff regarding response in next visit. Patient provided educational handout for iontophoresis.    Rehab Potential  Excellent    PT Frequency  2x / week    PT Duration  4 weeks    PT Treatment/Interventions  ADLs/Self Care Home Management;Cryotherapy;Clinical cytogeneticist;Therapeutic activities;Therapeutic exercise;Neuromuscular re-education;Patient/family education;Passive range of motion;Manual techniques;Vasopneumatic Device;Stair training;Moist Heat;Ultrasound;Iontophoresis 4mg /ml Dexamethasone;Dry needling;Taping;Balance training    PT Next Visit Plan  Bike, pain free quad strengthening, L hip/glute strengthening, modalities PRN for pain relief.    PT Home Exercise Plan  see pt education section    Consulted and Agree with Plan of Care  Patient       Patient will benefit from skilled therapeutic intervention in order to improve the following deficits and impairments:  Pain, Decreased activity tolerance, Decreased endurance, Decreased range of motion, Decreased strength, Increased edema  Visit Diagnosis: Chronic pain of left knee  Muscle weakness (generalized)     Problem List Patient Active Problem List   Diagnosis Date Noted  . Posterior tibial tendinitis, right leg 08/16/2017    Marvell Fuller, PTA 05/16/2018, 10:06 AM  [facility] [address] [place], [place], [address] Phone: [phone]   Fax:  [phone]  Name: Shannon Moon MRN:  [id number] Date of Birth: Jan 07, 1986

## 2018-05-21 ENCOUNTER — Ambulatory Visit: Payer: BLUE CROSS/BLUE SHIELD | Admitting: Physical Therapy

## 2018-05-21 ENCOUNTER — Encounter: Payer: Self-pay | Admitting: Physical Therapy

## 2018-05-21 DIAGNOSIS — M25562 Pain in left knee: Principal | ICD-10-CM

## 2018-05-21 DIAGNOSIS — M6281 Muscle weakness (generalized): Secondary | ICD-10-CM | POA: Diagnosis not present

## 2018-05-21 DIAGNOSIS — G8929 Other chronic pain: Secondary | ICD-10-CM

## 2018-05-21 NOTE — Therapy (Signed)
[facility] [address] [place], [place], [address] Phone: [phone]   Fax:  [phone]  Physical Therapy Treatment  Patient Details  Name: Shannon Moon MRN: [id number] Date of Birth: 1986/07/04 Referring Provider (PT): Doneen Poisson, MD   Encounter Date: 05/21/2018  PT End of Session - 05/21/18 1119    Visit Number  3    Number of Visits  8    Date for PT Re-Evaluation  06/17/18    PT Start Time  1116    PT Stop Time  1203    PT Time Calculation (min)  47 min    Activity Tolerance  Patient tolerated treatment well    Behavior During Therapy  [facility] for tasks assessed/performed       Past Medical History:  Diagnosis Date  . Pregnancy     Past Surgical History:  Procedure Laterality Date  . ADENOIDECTOMY    . CESAREAN SECTION    . TONSILLECTOMY      There were no vitals filed for this visit.  Subjective Assessment - 05/21/18 1118    Subjective  Reports that she flew to [place] last week and once the plane ascended she was having knee discomfort but once they landed she was okay. Reports that the patch did well.    Pertinent History  Right ankle sprain 4/18.    Limitations  Standing;Walking;Other (comment)    Patient Stated Goals  Walk without pain and left knee giving way.    Currently in Pain?  Yes    Pain Score  2     Pain Location  Knee    Pain Orientation  Left    Pain Descriptors / Indicators  Discomfort    Pain Type  Acute pain    Pain Onset  More than a month ago         [facility] PT Assessment - 05/21/18 0001      Assessment   Medical Diagnosis  Chronic right knee pain    Next MD Visit  End of October 2019    Prior Therapy  not for knee      Precautions   Precautions  None      Restrictions   Weight Bearing Restrictions  No                   [facility] Adult PT Treatment/Exercise - 05/21/18 0001      Knee/Hip Exercises: Aerobic   Stationary Bike  L3 x10 min      Knee/Hip  Exercises: Standing   Terminal Knee Extension  Strengthening;Left;20 reps;Limitations    Terminal Knee Extension Limitations  Green theraband      Knee/Hip Exercises: Seated   Long Arc Quad  Strengthening;Left;2 sets;10 reps;Weights    Long Arc Quad Weight  3 lbs.      Knee/Hip Exercises: Supine   Bridges with Beacher May  Strengthening;15 reps    Straight Leg Raises  Strengthening;Right;20 reps    Straight Leg Raise with External Rotation  Strengthening;Left;10 reps      Modalities   Modalities  Vasopneumatic;Iontophoresis      Iontophoresis   Type of Iontophoresis  Dexamethasone    Location  L lateral knee    Dose  1.0 ml    Time  8      Vasopneumatic   Number Minutes Vasopneumatic   10 minutes    Vasopnuematic Location   Knee    Vasopneumatic Pressure  Low    Vasopneumatic Temperature  34                  PT Long Term Goals - 05/21/18 1155      PT LONG TERM GOAL #1   Title  Patient will be independent with HEP and progression    Time  4    Period  Weeks    Status  On-going      PT LONG TERM GOAL #2   Title  Patient will demonstrate full left knee AROM, 0-120+ degrees to improve ability to perform functional tasks.    Baseline       Time  4    Period  Weeks    Status  On-going      PT LONG TERM GOAL #3   Title  Patient will demonstrate 4+/5 or greater left knee MMT in all planes to improve stability during functional tasks.     Baseline       Time  4    Period  Weeks    Status  On-going      PT LONG TERM GOAL #4   Title  Patient will report ability to walk long distances for work task with less than 4/10 left knee pain and no reports of knee buckling or giving way.    Baseline       Time  4    Period  Weeks    Status  On-going            Plan - 05/21/18 1155    Clinical Impression Statement  Patient tolerated today's treatment fairly well as she arrived with low level L knee pain. Patient able to complete exercises although in a slow  pace. Weakness still noted in quad and VMO at this time with minimal extensor lag still noted. Normal vasopneumatic response noted following removal of the modality. Iontophoresis patch donned to lateral L knee with patient educated to remove in 4 hours.    Rehab Potential  Excellent    PT Frequency  2x / week    PT Duration  4 weeks    PT Treatment/Interventions  ADLs/Self Care Home Management;Cryotherapy;Clinical cytogeneticist;Therapeutic activities;Therapeutic exercise;Neuromuscular re-education;Patient/family education;Passive range of motion;Manual techniques;Vasopneumatic Device;Stair training;Moist Heat;Ultrasound;Iontophoresis 4mg /ml Dexamethasone;Dry needling;Taping;Balance training    PT Next Visit Plan  Bike, pain free quad strengthening, L hip/glute strengthening, modalities PRN for pain relief.    PT Home Exercise Plan  see pt education section    Consulted and Agree with Plan of Care  Patient       Patient will benefit from skilled therapeutic intervention in order to improve the following deficits and impairments:  Pain, Decreased activity tolerance, Decreased endurance, Decreased range of motion, Decreased strength, Increased edema  Visit Diagnosis: Chronic pain of left knee  Muscle weakness (generalized)     Problem List Patient Active Problem List   Diagnosis Date Noted  . Posterior tibial tendinitis, right leg 08/16/2017    Marvell Fuller, PTA 05/21/2018, 12:05 PM  [facility] [address] [place], [place], [address] Phone: [phone]   Fax:  [phone]  Name: Shannon Moon MRN: [id number] Date of Birth: 1986/06/17

## 2018-05-22 ENCOUNTER — Ambulatory Visit: Payer: BLUE CROSS/BLUE SHIELD | Admitting: Physical Therapy

## 2018-05-22 ENCOUNTER — Encounter: Payer: Self-pay | Admitting: Physical Therapy

## 2018-05-22 DIAGNOSIS — M6281 Muscle weakness (generalized): Secondary | ICD-10-CM

## 2018-05-22 DIAGNOSIS — G8929 Other chronic pain: Secondary | ICD-10-CM | POA: Diagnosis not present

## 2018-05-22 DIAGNOSIS — M25562 Pain in left knee: Secondary | ICD-10-CM | POA: Diagnosis not present

## 2018-05-22 NOTE — Therapy (Signed)
[facility] [address] [place], [place], [address] Phone: [phone]   Fax:  [phone]  Physical Therapy Treatment  Patient Details  Name: Shannon Moon MRN: [id number] Date of Birth: Dec 02, 1985 Referring Provider (PT): Doneen Poisson, MD   Encounter Date: 05/22/2018  PT End of Session - 05/22/18 1113    Visit Number  4    Number of Visits  8    Date for PT Re-Evaluation  06/17/18    PT Start Time  1115    PT Stop Time  1158    PT Time Calculation (min)  43 min    Activity Tolerance  Patient tolerated treatment well    Behavior During Therapy  [facility] for tasks assessed/performed       Past Medical History:  Diagnosis Date  . Pregnancy     Past Surgical History:  Procedure Laterality Date  . ADENOIDECTOMY    . CESAREAN SECTION    . TONSILLECTOMY      There were no vitals filed for this visit.  Subjective Assessment - 05/22/18 1112    Subjective  Denies any pain upon arrival. Reports that she returns to work tomorrow.    Pertinent History  Right ankle sprain 4/18.    Limitations  Standing;Walking;Other (comment)    Patient Stated Goals  Walk without pain and left knee giving way.    Currently in Pain?  No/denies         [facility] PT Assessment - 05/22/18 0001      Assessment   Medical Diagnosis  Chronic right knee pain    Next MD Visit  End of October 2019    Prior Therapy  not for knee      Precautions   Precautions  None      Restrictions   Weight Bearing Restrictions  No      ROM / Strength   AROM / PROM / Strength  AROM      AROM   Overall AROM   Within functional limits for tasks performed    AROM Assessment Site  Knee    Right/Left Knee  Left    Left Knee Extension  0    Left Knee Flexion  120                   [facility] Adult PT Treatment/Exercise - 05/22/18 0001      Knee/Hip Exercises: Aerobic   Stationary Bike  L3 x13 min      Knee/Hip Exercises: Standing   Terminal Knee  Extension  Strengthening;Left;20 reps;Limitations    Terminal Knee Extension Limitations  Green theraband      Knee/Hip Exercises: Seated   Long Arc Quad  Strengthening;Left;2 sets;10 reps;Weights    Long Arc Quad Weight  3 lbs.      Knee/Hip Exercises: Supine   Bridges with Beacher May  Strengthening;20 reps    Straight Leg Raises  Strengthening;Right;20 reps    Straight Leg Raise with External Rotation  Strengthening;Left;20 reps      Modalities   Modalities  Vasopneumatic;Iontophoresis      Iontophoresis   Type of Iontophoresis  Dexamethasone    Location  L lateral knee    Dose  1.0 ml    Time  8      Vasopneumatic   Number Minutes Vasopneumatic   10 minutes    Vasopnuematic Location   Knee    Vasopneumatic Pressure  Medium    Vasopneumatic Temperature   67  PT Long Term Goals - 05/22/18 1201      PT LONG TERM GOAL #1   Title  Patient will be independent with HEP and progression    Time  4    Period  Weeks    Status  On-going      PT LONG TERM GOAL #2   Title  Patient will demonstrate full left knee AROM, 0-120+ degrees to improve ability to perform functional tasks.    Baseline       Time  4    Period  Weeks    Status  Achieved      PT LONG TERM GOAL #3   Title  Patient will demonstrate 4+/5 or greater left knee MMT in all planes to improve stability during functional tasks.     Baseline       Time  4    Period  Weeks    Status  On-going      PT LONG TERM GOAL #4   Title  Patient will report ability to walk long distances for work task with less than 4/10 left knee pain and no reports of knee buckling or giving way.    Baseline       Time  4    Period  Weeks    Status  On-going            Plan - 05/22/18 1203    Clinical Impression Statement  Patient tolerated today's treatment fairly well as she arrived with no L knee pain. Patient reported minimal knee discomfort and posterior knee popping with TKE and green  theraband. Very slow pace continued throughout treatment today. Minimal extensor lag present with supine strengthening. Patient very slow to fully extend L knee during AROM measurements. Normal vasopneumatic response noted following removal of the modality. Iontophoresis again donned to L lateral knee per patient request.    Rehab Potential  Excellent    PT Frequency  2x / week    PT Duration  4 weeks    PT Treatment/Interventions  ADLs/Self Care Home Management;Cryotherapy;Clinical cytogeneticist;Therapeutic activities;Therapeutic exercise;Neuromuscular re-education;Patient/family education;Passive range of motion;Manual techniques;Vasopneumatic Device;Stair training;Moist Heat;Ultrasound;Iontophoresis 4mg /ml Dexamethasone;Dry needling;Taping;Balance training    PT Next Visit Plan  Bike, pain free quad strengthening, L hip/glute strengthening, modalities PRN for pain relief.    PT Home Exercise Plan  see pt education section    Consulted and Agree with Plan of Care  Patient       Patient will benefit from skilled therapeutic intervention in order to improve the following deficits and impairments:  Pain, Decreased activity tolerance, Decreased endurance, Decreased range of motion, Decreased strength, Increased edema  Visit Diagnosis: Chronic pain of left knee  Muscle weakness (generalized)     Problem List Patient Active Problem List   Diagnosis Date Noted  . Posterior tibial tendinitis, right leg 08/16/2017    Marvell Fuller, PTA 05/22/2018, 12:06 PM  [facility] [address] [place], [place], [address] Phone: [phone]   Fax:  [phone]  Name: Shannon Moon MRN: [id number] Date of Birth: 1986-04-29

## 2018-05-29 ENCOUNTER — Ambulatory Visit: Payer: BLUE CROSS/BLUE SHIELD | Admitting: Physical Therapy

## 2018-05-29 ENCOUNTER — Encounter: Payer: Self-pay | Admitting: Physical Therapy

## 2018-05-29 DIAGNOSIS — M25562 Pain in left knee: Principal | ICD-10-CM

## 2018-05-29 DIAGNOSIS — G8929 Other chronic pain: Secondary | ICD-10-CM

## 2018-05-29 DIAGNOSIS — M6281 Muscle weakness (generalized): Secondary | ICD-10-CM

## 2018-05-29 NOTE — Therapy (Signed)
[facility] [address] [place], [place], [address] Phone: [phone]   Fax:  [phone]  Physical Therapy Treatment  Patient Details  Name: Shannon Moon MRN: [id number] Date of Birth: 09-15-1985 Referring Provider (PT): Doneen Poisson, MD   Encounter Date: 05/29/2018  PT End of Session - 05/29/18 1135    Visit Number  5    Number of Visits  8    Date for PT Re-Evaluation  06/17/18    PT Start Time  1115    PT Stop Time  1203    PT Time Calculation (min)  48 min    Activity Tolerance  Patient tolerated treatment well    Behavior During Therapy  [facility] for tasks assessed/performed       Past Medical History:  Diagnosis Date  . Pregnancy     Past Surgical History:  Procedure Laterality Date  . ADENOIDECTOMY    . CESAREAN SECTION    . TONSILLECTOMY      There were no vitals filed for this visit.  Subjective Assessment - 05/29/18 1137    Subjective  Patient states her left knee pain has increased since returning to work. States she has been experiencing "popping" behind the knee and her knee gave out the first day returning to work. Patient also states pain is more significant due to the weather as well.     Pertinent History  Right ankle sprain 4/18.    Limitations  Standing;Walking;Other (comment)    Patient Stated Goals  Walk without pain and left knee giving way.    Currently in Pain?  Yes    Pain Score  6     Pain Location  Knee    Pain Orientation  Left    Pain Descriptors / Indicators  Aching;Dull    Pain Type  Acute pain    Pain Onset  More than a month ago    Pain Frequency  Constant         [facility] PT Assessment - 05/29/18 0001      Assessment   Medical Diagnosis  Chronic right knee pain    Next MD Visit  End of October 2019    Prior Therapy  not for knee                   [facility] Adult PT Treatment/Exercise - 05/29/18 0001      Exercises   Exercises  Knee/Hip      Knee/Hip  Exercises: Aerobic   Stationary Bike  L3 x10 min      Knee/Hip Exercises: Machines for Strengthening   Cybex Knee Extension  10# 2 minutes    Cybex Knee Flexion  10# 2 minutes    Cybex Leg Press  1 plate x3 minutes      Knee/Hip Exercises: Supine   Short Arc Quad Sets  Strengthening;Left;3 sets;10 reps   3 second hold   Straight Leg Raises  Strengthening;Right;20 reps   attempted but increased ext lag due to pain; terminated     Knee/Hip Exercises: Prone   Hamstring Curl  2 sets;10 reps;3 seconds    Hip Extension  AROM;Left;10 reps;3 sets      Modalities   Modalities  Vasopneumatic;Iontophoresis      Iontophoresis   Type of Iontophoresis  Dexamethasone    Location  L lateral knee    Dose  1.0 ml    Time  8 (4/6)      Vasopneumatic   Number Minutes Vasopneumatic  10 minutes    Vasopnuematic Location   Knee    Vasopneumatic Pressure  Medium    Vasopneumatic Temperature   61                  PT Long Term Goals - 05/22/18 1201      PT LONG TERM GOAL #1   Title  Patient will be independent with HEP and progression    Time  4    Period  Weeks    Status  On-going      PT LONG TERM GOAL #2   Title  Patient will demonstrate full left knee AROM, 0-120+ degrees to improve ability to perform functional tasks.    Baseline       Time  4    Period  Weeks    Status  Achieved      PT LONG TERM GOAL #3   Title  Patient will demonstrate 4+/5 or greater left knee MMT in all planes to improve stability during functional tasks.     Baseline       Time  4    Period  Weeks    Status  On-going      PT LONG TERM GOAL #4   Title  Patient will report ability to walk long distances for work task with less than 4/10 left knee pain and no reports of knee buckling or giving way.    Baseline       Time  4    Period  Weeks    Status  On-going            Plan - 05/29/18 1207    Clinical Impression Statement  Patient was able to tolerate treatment well despite left  knee pain at start of session. Patient instructed to perform SLR but noted with increased extension lag secondary to pain therefore exercises was terminated. Patient noted with intermittent popping with SAQ. PT palpated popping in posteriomedial aspect of left knee intermittently with extension. Normal response to modalities upon removal. Patient was able to demonstrate strong carryover of iontophoresis removal time.     Clinical Presentation  Stable    Clinical Decision Making  Low    Rehab Potential  Excellent    PT Frequency  2x / week    PT Duration  4 weeks    PT Treatment/Interventions  ADLs/Self Care Home Management;Cryotherapy;Clinical cytogeneticist;Therapeutic activities;Therapeutic exercise;Neuromuscular re-education;Patient/family education;Passive range of motion;Manual techniques;Vasopneumatic Device;Stair training;Moist Heat;Ultrasound;Iontophoresis 4mg /ml Dexamethasone;Dry needling;Taping;Balance training    PT Next Visit Plan  Cont plan of care Bike, pain free quad strengthening, L hip/glute strengthening, modalities PRN for pain relief.    Consulted and Agree with Plan of Care  Patient       Patient will benefit from skilled therapeutic intervention in order to improve the following deficits and impairments:  Pain, Decreased activity tolerance, Decreased endurance, Decreased range of motion, Decreased strength, Increased edema  Visit Diagnosis: Chronic pain of left knee  Muscle weakness (generalized)     Problem List Patient Active Problem List   Diagnosis Date Noted  . Posterior tibial tendinitis, right leg 08/16/2017   Guss Bunde, PT, DPT 05/29/2018, 12:12 PM  [facility] [address] [place], [place], [address] Phone: [phone]   Fax:  [phone]  Name: Shannon Moon MRN: [id number] Date of Birth: November 28, 1985

## 2018-05-30 ENCOUNTER — Ambulatory Visit: Payer: BLUE CROSS/BLUE SHIELD | Admitting: Physical Therapy

## 2018-05-30 ENCOUNTER — Encounter: Payer: Self-pay | Admitting: Physical Therapy

## 2018-05-30 DIAGNOSIS — M6281 Muscle weakness (generalized): Secondary | ICD-10-CM | POA: Diagnosis not present

## 2018-05-30 DIAGNOSIS — M25562 Pain in left knee: Secondary | ICD-10-CM | POA: Diagnosis not present

## 2018-05-30 DIAGNOSIS — G8929 Other chronic pain: Secondary | ICD-10-CM

## 2018-05-30 NOTE — Therapy (Signed)
[facility] [address] [place], [place], [address] Phone: [phone]   Fax:  [phone]  Physical Therapy Treatment  Patient Details  Name: Shannon Moon MRN: [id number] Date of Birth: June 05, 1986 Referring Provider (PT): Doneen Poisson, MD   Encounter Date: 05/30/2018  PT End of Session - 05/30/18 0742    Visit Number  6    Number of Visits  8    Date for PT Re-Evaluation  06/17/18    PT Start Time  0730    PT Stop Time  0818    PT Time Calculation (min)  48 min    Activity Tolerance  Patient tolerated treatment well    Behavior During Therapy  [facility] for tasks assessed/performed       Past Medical History:  Diagnosis Date  . Pregnancy     Past Surgical History:  Procedure Laterality Date  . ADENOIDECTOMY    . CESAREAN SECTION    . TONSILLECTOMY      There were no vitals filed for this visit.  Subjective Assessment - 05/30/18 0733    Subjective  Reports that work is really making her knee hurt.    Pertinent History  Right ankle sprain 4/18.    Limitations  Standing;Walking;Other (comment)    Patient Stated Goals  Walk without pain and left knee giving way.    Currently in Pain?  Yes    Pain Score  5     Pain Location  Knee    Pain Orientation  Left;Anterior    Pain Descriptors / Indicators  Sore    Pain Type  Acute pain    Pain Onset  More than a month ago         [facility] PT Assessment - 05/30/18 0001      Assessment   Medical Diagnosis  Chronic right knee pain    Next MD Visit  End of October 2019    Prior Therapy  not for knee      Precautions   Precautions  None      Restrictions   Weight Bearing Restrictions  No                   [facility] Adult PT Treatment/Exercise - 05/30/18 0001      Exercises   Exercises  Knee/Hip      Knee/Hip Exercises: Stretches   Passive Hamstring Stretch  Left;3 reps;30 seconds    Passive Hamstring Stretch Limitations  minimal range    ITB Stretch   Left;3 reps;30 seconds      Knee/Hip Exercises: Aerobic   Stationary Bike  L3 x12 min      Knee/Hip Exercises: Machines for Strengthening   Cybex Knee Flexion  30# 2x10 reps      Knee/Hip Exercises: Standing   Rocker Board  2 minutes      Knee/Hip Exercises: Supine   Straight Leg Raises  Strengthening;Right;20 reps      Knee/Hip Exercises: Prone   Hamstring Curl  2 sets;10 reps;3 seconds      Modalities   Modalities  Vasopneumatic      Vasopneumatic   Number Minutes Vasopneumatic   15 minutes    Vasopnuematic Location   Knee    Vasopneumatic Pressure  Low    Vasopneumatic Temperature   34                  PT Long Term Goals - 05/22/18 1201      PT LONG TERM  GOAL #1   Title  Patient will be independent with HEP and progression    Time  4    Period  Weeks    Status  On-going      PT LONG TERM GOAL #2   Title  Patient will demonstrate full left knee AROM, 0-120+ degrees to improve ability to perform functional tasks.    Baseline       Time  4    Period  Weeks    Status  Achieved      PT LONG TERM GOAL #3   Title  Patient will demonstrate 4+/5 or greater left knee MMT in all planes to improve stability during functional tasks.     Baseline       Time  4    Period  Weeks    Status  On-going      PT LONG TERM GOAL #4   Title  Patient will report ability to walk long distances for work task with less than 4/10 left knee pain and no reports of knee buckling or giving way.    Baseline       Time  4    Period  Weeks    Status  On-going            Plan - 05/30/18 0810    Clinical Impression Statement  Patient able to tolerate today's treatment fairly well as she presented with increased anterior knee soreness. Patient able to perform exercises at slow pace but no complaints of any pain. Patient required increased cueing for proper stretch technique. Normal vasopneumatic response noted following removal of the modality. No iontophoresis patch donned  secondary to no long term relief.    Rehab Potential  Excellent    PT Frequency  2x / week    PT Duration  4 weeks    PT Treatment/Interventions  ADLs/Self Care Home Management;Cryotherapy;Clinical cytogeneticist;Therapeutic activities;Therapeutic exercise;Neuromuscular re-education;Patient/family education;Passive range of motion;Manual techniques;Vasopneumatic Device;Stair training;Moist Heat;Ultrasound;Iontophoresis 4mg /ml Dexamethasone;Dry needling;Taping;Balance training    PT Next Visit Plan  Cont plan of care Bike, pain free quad strengthening, L hip/glute strengthening, modalities PRN for pain relief.    PT Home Exercise Plan  see pt education section    Consulted and Agree with Plan of Care  Patient       Patient will benefit from skilled therapeutic intervention in order to improve the following deficits and impairments:  Pain, Decreased activity tolerance, Decreased endurance, Decreased range of motion, Decreased strength, Increased edema  Visit Diagnosis: Chronic pain of left knee  Muscle weakness (generalized)     Problem List Patient Active Problem List   Diagnosis Date Noted  . Posterior tibial tendinitis, right leg 08/16/2017    Marvell Fuller, PTA 05/30/2018, 9:07 AM  [facility] [address] [place], [place], [address] Phone: [phone]   Fax:  [phone]  Name: Shannon Moon MRN: [id number] Date of Birth: 15-Jan-1986

## 2018-06-05 ENCOUNTER — Ambulatory Visit (INDEPENDENT_AMBULATORY_CARE_PROVIDER_SITE_OTHER): Payer: BLUE CROSS/BLUE SHIELD | Admitting: Orthopaedic Surgery

## 2018-06-05 ENCOUNTER — Encounter (INDEPENDENT_AMBULATORY_CARE_PROVIDER_SITE_OTHER): Payer: Self-pay | Admitting: Orthopaedic Surgery

## 2018-06-05 DIAGNOSIS — M25562 Pain in left knee: Secondary | ICD-10-CM

## 2018-06-05 DIAGNOSIS — G8929 Other chronic pain: Secondary | ICD-10-CM

## 2018-06-05 NOTE — Progress Notes (Signed)
The patient is continued to have severe pain in her left knee with locking catching and symptoms of instability.  She is been going to physical therapy for her knee.  Is been going on since her knee gave out on her August.  On exam there is no knee joint effusion.  Her knee is ligamentously stable on exam of the left knee.  The range of motion is full.  She has a lot of guarding and pain with her mobility of that knee.  MRI of the left knee showed no internal derangement at all.  I will also explain why her knee gives out on her or why she feels the pain that she has based on my clinical exam and the MRI.  I cautioned her about having a surgery because most likely a arthroscopic intervention would not do anything for her knee and could certainly damage things.  I will release give this more time.  I would like to reevaluate her in 4 weeks.

## 2018-06-06 ENCOUNTER — Ambulatory Visit: Payer: BLUE CROSS/BLUE SHIELD | Admitting: Physical Therapy

## 2018-06-06 ENCOUNTER — Encounter: Payer: Self-pay | Admitting: Physical Therapy

## 2018-06-06 DIAGNOSIS — M25562 Pain in left knee: Secondary | ICD-10-CM | POA: Diagnosis not present

## 2018-06-06 DIAGNOSIS — G8929 Other chronic pain: Secondary | ICD-10-CM

## 2018-06-06 DIAGNOSIS — M6281 Muscle weakness (generalized): Secondary | ICD-10-CM

## 2018-06-06 NOTE — Patient Instructions (Signed)
  Step-Down / Step-Up   Stand on stair step or __6__ inch stool. Slowly bend left leg, lowering other foot to floor. Return by straightening front leg. Repeat __10__ times per set. Do __2-3__ sets per session. Do __2-3__ sessions per day.    Strengthening: Hip Abduction (Side-Lying)   Tighten muscles on front of left thigh, then lift leg __5__ inches from surface, keeping knee locked.  Repeat __10__ times per set. Do __2__ sets per session. Do _2___ sessions per day.    Terminal Knee Extension (Standing)    Facing anchor with right knee slightly bent and tubing just above knee, gently pull knee back straight. Do not overextend knee. Repeat _10___ times per set. Do __1-3__ sets per session. Do _1-2___ sessions per day.    

## 2018-06-06 NOTE — Therapy (Signed)
[facility] [place], [place], [address] Phone: [phone]   Fax:  [phone]  Physical Therapy Treatment  Patient Details  Name: Shannon Moon MRN: [id number] Date of Birth: [birth year] Referring Provider (PT): Jean Rosenthal, MD   Encounter Date: 06/06/2018  PT End of Session - 06/06/18 0809    Visit Number  7    Number of Visits  8    Date for PT Re-Evaluation  06/17/18    PT Start Time  0729    PT Stop Time  0826    PT Time Calculation (min)  57 min    Activity Tolerance  Patient tolerated treatment well    Behavior During Therapy  [facility] for tasks assessed/performed       Past Medical History:  Diagnosis Date  . Pregnancy     Past Surgical History:  Procedure Laterality Date  . ADENOIDECTOMY    . CESAREAN SECTION    . TONSILLECTOMY      There were no vitals filed for this visit.  Subjective Assessment - 06/06/18 0737    Subjective  Patient arrived with some ongoing discomfort, yet less with rest and more with weight bearing. Patient went to MD yesterday he is reported not seeing any reason why her knee continues to hurt and is to see her back after 4 weeks then refer her to another MD, MD advised her against surgery.     Pertinent History  Right ankle sprain 4/18.    Limitations  Standing;Walking;Other (comment)    Patient Stated Goals  Walk without pain and left knee giving way.    Currently in Pain?  Yes    Pain Score  5     Pain Location  Knee    Pain Orientation  Left;Anterior    Pain Descriptors / Indicators  Sore;Discomfort    Pain Type  Acute pain    Pain Onset  More than a month ago    Pain Frequency  Intermittent    Aggravating Factors   weight bearing    Pain Relieving Factors  rest and ice         [facility] PT Assessment - 06/06/18 0001      Strength   Strength Assessment Site  Knee;Hip    Right/Left Hip  Left    Left Hip Extension  4/5    Left Hip ABduction  4/5    Left Knee  Flexion  4-/5    Left Knee Extension  4/5                   [facility] Adult PT Treatment/Exercise - 06/06/18 0001      Exercises   Exercises  Knee/Hip      Knee/Hip Exercises: Aerobic   Stationary Bike  L3 x12 min      Knee/Hip Exercises: Machines for Strengthening   Cybex Knee Extension  10# 3x10    Cybex Knee Flexion  30# 3x10 reps    Cybex Leg Press  1plt 3x10 with ball squeeze      Knee/Hip Exercises: Standing   Terminal Knee Extension  Strengthening;Left;20 reps;10 reps    Terminal Knee Extension Limitations  Green theraband    Forward Step Up  Left;2 sets;10 reps;Step Height: 4";Hand Hold: 1    Step Down  Left;2 sets;10 reps;Step Height: 4";Hand Hold: 2      Knee/Hip Exercises: Sidelying   Hip ABduction  Strengthening;Left;2 sets;10 reps      Vasopneumatic   Number Minutes  Vasopneumatic   15 minutes    Vasopnuematic Location   Knee    Vasopneumatic Pressure  Low             PT Education - 06/06/18 0757    Education provided  Yes    Education Details  HEP     Person(s) Educated  Patient    Methods  Explanation;Demonstration;Handout    Comprehension  Verbalized understanding;Returned demonstration          PT Long Term Goals - 06/06/18 0740      PT LONG TERM GOAL #1   Title  Patient will be independent with HEP and progression    Baseline  MET 06/06/18    Time  4    Period  Weeks    Status  Achieved      PT LONG TERM GOAL #2   Title  Patient will demonstrate full left knee AROM, 0-120+ degrees to improve ability to perform functional tasks.    Time  4    Period  Weeks    Status  Achieved      PT LONG TERM GOAL #3   Title  Patient will demonstrate 4+/5 or greater left knee MMT in all planes to improve stability during functional tasks.     Time  4    Period  Weeks    Status  On-going   MMT taken for LE see note for measurements 06/06/18     PT LONG TERM GOAL #4   Title  Patient will report ability to walk long distances for work  task with less than 4/10 left knee pain and no reports of knee buckling or giving way.    Time  4    Status  On-going   patient reported ongoing episodes of left knee buckling for unknown reason 06/06/18           Plan - 06/06/18 0813    Clinical Impression Statement  Patient tolerated treatment well today. Patient able to complete all exercises with some complaints of difficulty due to weakness. Patient continues to work her normal hours and reported increased discomfort when working up to 7-8/10. Patient has reported episodes of left knee giving way at times with walking. Patient has improved left LE strength yet unable to meet strength goal today, may be limited with due to pain. HEP given today for progression. Met LTG #1 today others ongoing.     Rehab Potential  Excellent    PT Frequency  2x / week    PT Duration  4 weeks    PT Treatment/Interventions  ADLs/Self Care Home Management;Cryotherapy;Advice worker;Therapeutic activities;Therapeutic exercise;Neuromuscular re-education;Patient/family education;Passive range of motion;Manual techniques;Vasopneumatic Device;Stair training;Moist Heat;Ultrasound;Iontophoresis 53m/ml Dexamethasone;Dry needling;Taping;Balance training    PT Next Visit Plan  PT to determine DC next visit    Consulted and Agree with Plan of Care  Patient       Patient will benefit from skilled therapeutic intervention in order to improve the following deficits and impairments:  Pain, Decreased activity tolerance, Decreased endurance, Decreased range of motion, Decreased strength, Increased edema  Visit Diagnosis: Chronic pain of left knee  Muscle weakness (generalized)     Problem List Patient Active Problem List   Diagnosis Date Noted  . Posterior tibial tendinitis, right leg 08/16/2017    Fawaz Borquez P, PTA 06/06/2018, 8:28 AM  [facility]  [address]Phone: [phone]  Fax:  [phone] Name: Shannon PENNYWELLMRN:  [id number] Date of Birth: 1985-10-23

## 2018-06-07 ENCOUNTER — Ambulatory Visit: Payer: BLUE CROSS/BLUE SHIELD | Admitting: Physical Therapy

## 2018-06-07 ENCOUNTER — Encounter: Payer: Self-pay | Admitting: Physical Therapy

## 2018-06-07 DIAGNOSIS — G8929 Other chronic pain: Secondary | ICD-10-CM

## 2018-06-07 DIAGNOSIS — M6281 Muscle weakness (generalized): Secondary | ICD-10-CM

## 2018-06-07 DIAGNOSIS — M25562 Pain in left knee: Secondary | ICD-10-CM | POA: Diagnosis not present

## 2018-06-07 NOTE — Therapy (Signed)
[facility] [place], [place], [address] Phone: [phone]   Fax:  [phone]  Physical Therapy Treatment/Discharge   Patient Details  Name: Shannon Moon MRN: [id number] Date of Birth: January 12, 1986 Referring Provider (PT): Jean Rosenthal, MD   Encounter Date: 06/07/2018  PT End of Session - 06/07/18 0738    Visit Number  8    Number of Visits  8    Date for PT Re-Evaluation  06/17/18    PT Start Time  0732    PT Stop Time  0829    PT Time Calculation (min)  57 min    Activity Tolerance  Patient tolerated treatment well    Behavior During Therapy  [facility] for tasks assessed/performed       Past Medical History:  Diagnosis Date  . Pregnancy     Past Surgical History:  Procedure Laterality Date  . ADENOIDECTOMY    . CESAREAN SECTION    . TONSILLECTOMY      There were no vitals filed for this visit.  Subjective Assessment - 06/07/18 0738    Subjective  Patient reported feeling alright, just "tight and tingly" and stated it may be from the weather.    Pertinent History  Right ankle sprain 4/18.    Limitations  Standing;Walking;Other (comment)    Patient Stated Goals  Walk without pain and left knee giving way.    Currently in Pain?  Yes    Pain Score  --   did not provide pain scale        [facility] PT Assessment - 06/07/18 0001      Assessment   Medical Diagnosis  Chronic right knee pain    Next MD Visit  End of October 2019    Prior Therapy  not for knee      Precautions   Precautions  None      Restrictions   Weight Bearing Restrictions  No                   [facility] Adult PT Treatment/Exercise - 06/07/18 0001      Exercises   Exercises  Knee/Hip      Knee/Hip Exercises: Aerobic   Stationary Bike  L3 x12 min      Knee/Hip Exercises: Machines for Strengthening   Cybex Knee Extension  20# 3x10    Cybex Knee Flexion  30# 3x10 reps    Cybex Leg Press  1plt 3x10 with ball squeeze       Knee/Hip Exercises: Standing   Terminal Knee Extension  --    Terminal Knee Extension Limitations  --    Wall Squat  2 sets;10 reps;3 seconds      Knee/Hip Exercises: Supine   Straight Leg Raises  Strengthening;Right;20 reps      Knee/Hip Exercises: Sidelying   Hip ABduction  Strengthening;Left;10 reps;3 sets      Modalities   Modalities  Vasopneumatic      Vasopneumatic   Number Minutes Vasopneumatic   15 minutes    Vasopnuematic Location   Knee    Vasopneumatic Pressure  Low                   PT Long Term Goals - 06/07/18 0807      PT LONG TERM GOAL #1   Title  Patient will be independent with HEP and progression    Baseline  MET 06/06/18    Time  4    Period  Weeks  Status  Achieved      PT LONG TERM GOAL #2   Title  Patient will demonstrate full left knee AROM, 0-120+ degrees to improve ability to perform functional tasks.    Time  4    Period  Weeks    Status  Achieved      PT LONG TERM GOAL #3   Title  Patient will demonstrate 4+/5 or greater left knee MMT in all planes to improve stability during functional tasks.     Period  Weeks    Status  Not Met      PT LONG TERM GOAL #4   Title  Patient will report ability to walk long distances for work task with less than 4/10 left knee pain and no reports of knee buckling or giving way.    Time  4    Period  Weeks    Status  Not Met            Plan - 06/07/18 0757    Clinical Impression Statement  Patient was able to tolerate treatment well. Patient denied increase of pain just muscle fatigue. Patient and PT reviewed HEP provided yesterday to which patient was able to demonstrate good form. Patient instructed to attempt to perform exercises to fatigue x2 to help build up endurance and strength. Patient reported understanding. Goals were partially met. Discharge at this time.    Clinical Presentation  Stable    Clinical Decision Making  Low    Rehab Potential  Excellent    PT Frequency  2x /  week    PT Duration  4 weeks    PT Treatment/Interventions  ADLs/Self Care Home Management;Cryotherapy;Advice worker;Therapeutic activities;Therapeutic exercise;Neuromuscular re-education;Patient/family education;Passive range of motion;Manual techniques;Vasopneumatic Device;Stair training;Moist Heat;Ultrasound;Iontophoresis 67m/ml Dexamethasone;Dry needling;Taping;Balance training    PT Next Visit Plan  DC today.    Consulted and Agree with Plan of Care  Patient       Patient will benefit from skilled therapeutic intervention in order to improve the following deficits and impairments:  Pain, Decreased activity tolerance, Decreased endurance, Decreased range of motion, Decreased strength, Increased edema  Visit Diagnosis: Chronic pain of left knee  Muscle weakness (generalized)     Problem List Patient Active Problem List   Diagnosis Date Noted  . Posterior tibial tendinitis, right leg 08/16/2017   PHYSICAL THERAPY DISCHARGE SUMMARY  Visits from Start of Care: 8  Current functional level related to goals / functional outcomes: See above   Remaining deficits: See goals, strength goal and ambulation goal not met   Education / Equipment: HEP, theraband Plan: Patient agrees to discharge.  Patient goals were partially met. Patient is being discharged due to lack of progress.  ?????       KGabriela Eves PT, DPT 06/07/2018, 8:34 AM  [facility] [address]Phone: [phone]  Fax:  [phone] Name: Shannon MANTHEMRN: [id number] of Birth: 1May 18, 1987

## 2018-07-03 ENCOUNTER — Encounter (INDEPENDENT_AMBULATORY_CARE_PROVIDER_SITE_OTHER): Payer: Self-pay | Admitting: Orthopaedic Surgery

## 2018-07-03 ENCOUNTER — Ambulatory Visit (INDEPENDENT_AMBULATORY_CARE_PROVIDER_SITE_OTHER): Payer: BLUE CROSS/BLUE SHIELD | Admitting: Orthopaedic Surgery

## 2018-07-03 DIAGNOSIS — M25562 Pain in left knee: Secondary | ICD-10-CM

## 2018-07-03 DIAGNOSIS — G8929 Other chronic pain: Secondary | ICD-10-CM | POA: Diagnosis not present

## 2018-07-03 NOTE — Progress Notes (Signed)
The patient someone I been seeing since around August of this year.  She has symptoms of instability of her left knee where the knee just gives out on her.  Was interesting is the fact that both knees significantly hyperextend.  She is never injured her knees at all.  She points the posterior medial aspect of the left knee as a source of her discomfort.  I tried intra-articular injection and we work on quad strengthening exercises.  She had an MRI of her knee that showed no internal derangement.  She says some of the pain is easing off some of the knee still feels unstable to her and gives out on occasion.  She is been wearing a knee brace as well.  On exam when she stands her knees hyperextend significantly.  She is not morbidly obese.  She does have a BMI of 32.2.  When I isolate her left knee on exam I do not feel any instability in terms of all Lachman's or drawer sign.  Given the extent of her knee hyperextension and the symptoms she is having at the posterior medial aspect of her right knee I would like to have my partner Dr. August Saucerean assess her knee to see what his opinion is for this.  She seems to be very legitimate in her complaints and what she is feeling and is interesting to see again how much her knees hyperextend.  She is still working through all this and not taking any type of medications for pain other than some anti-inflammatories where she requesting anything.  She just wants her knee does not feel the symptoms that she is having.  She is asymptomatic on her right knee.  We will work on getting her set up for an appointment to see my partner Dr. August Saucerean.

## 2018-07-10 ENCOUNTER — Encounter (INDEPENDENT_AMBULATORY_CARE_PROVIDER_SITE_OTHER): Payer: Self-pay | Admitting: Orthopedic Surgery

## 2018-07-10 ENCOUNTER — Ambulatory Visit (INDEPENDENT_AMBULATORY_CARE_PROVIDER_SITE_OTHER): Payer: BLUE CROSS/BLUE SHIELD | Admitting: Orthopedic Surgery

## 2018-07-10 VITALS — Ht 62.0 in | Wt 173.0 lb

## 2018-07-10 DIAGNOSIS — S838X2D Sprain of other specified parts of left knee, subsequent encounter: Secondary | ICD-10-CM

## 2018-07-11 ENCOUNTER — Encounter (INDEPENDENT_AMBULATORY_CARE_PROVIDER_SITE_OTHER): Payer: Self-pay | Admitting: Orthopedic Surgery

## 2018-07-11 NOTE — Progress Notes (Signed)
Office Visit Note   Patient: Shannon Moon           Date of Birth: Oct 03, 1985           MRN: [id number] Visit Date: 07/10/2018 Requested by: Mechele Claude, MD [address] [place], [place] [address] PCP: Mechele Claude, MD  Subjective: Chief Complaint  Patient presents with  . Left Knee - Pain    HPI: Patient presents for evaluation of left knee pain and instability.  She wears a knee brace.  She states it gives out every now and then.  Takes ibuprofen as needed.  She describes popping in the back of her knee.  Injection helped but then the pain returned after several weeks.  This is been going on since August but she denies any history of injury.  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  Hard for her to hyperextend the left knee.  She localizes the pain posteriorly as well as cheerier superior laterally.  She has done a course of therapy.  She has had an MRI scan which is reviewed with the patient.  Report on the MRI scan is essentially negative except for some mild patellofemoral degenerative changes.  On my review I think she may have posterior horn medial meniscal degeneration and tear which is reaching the inferior articular surface.  ACL and PCL intact.              ROS: All systems reviewed are negative as they relate to the chief complaint within the history of present illness.  Patient denies  fevers or chills.   Assessment & Plan: Visit Diagnoses:  1. Injury of meniscus of left knee, subsequent encounter     Plan: Impression is left knee pain with possible medial meniscal tear.  I think for this patient diagnostic arthroscopy would be more exploration as opposed to surgery for definitive treatment.  For that reason I would favor that she consider carefully whether or not she wants exploratory arthroscopic surgery on her left knee.  I think there is a 50-50 chance we would find something treatable which would primarily be that posterior horn medial meniscus.   May require trimming or repair or it is conceivable that there may not be any type of degeneration or tear which reaches the inferior articular surface.  Nonetheless she is going to consider her options and let me know if she wants to proceed.  Stability wise her collateral crucial ligaments are stable and it does not look like she has any type of Wrisberg variant on that lateral meniscus.  Follow-Up Instructions: Return if symptoms worsen or fail to improve.   Orders:  No orders of the defined types were placed in this encounter.  No orders of the defined types were placed in this encounter.     Procedures: No procedures performed   Clinical Data: No additional findings.  Objective: Vital Signs: Ht 5\' 2"  (1.575 m)   Wt 173 lb (78.5 kg)   BMI 31.64 kg/m   Physical Exam:   Constitutional: Patient appears well-developed HEENT:  Head: Normocephalic Eyes:EOM are normal Neck: Normal range of motion Cardiovascular: Normal rate Pulmonary/chest: Effort normal Neurologic: Patient is alert Skin: Skin is warm Psychiatric: Patient has normal mood and affect    Ortho Exam: Ortho exam demonstrates full active and passive range of motion of that left knee.  There is some superior lateral tenderness at the patella.  The patella itself is stable with negative apprehension with  lateral translation.  No effusion in that left knee.  Pedal pulses palpable.  No other masses lymph adenopathy or skin changes noted in the left knee region.  Collateral and cruciate ligaments are stable.  Mild medial and lateral joint line tenderness is present.  She does have passive hyperextension of about 5 degrees bilaterally.  Specialty Comments:  No specialty comments available.  Imaging: No results found.   PMFS History: Patient Active Problem List   Diagnosis Date Noted  . Posterior tibial tendinitis, right leg 08/16/2017   Past Medical History:  Diagnosis Date  . Pregnancy     History reviewed.  No pertinent family history.  Past Surgical History:  Procedure Laterality Date  . ADENOIDECTOMY    . CESAREAN SECTION    . TONSILLECTOMY     Social History   Occupational History  . Not on file  Tobacco Use  . Smoking status: Never Smoker  . Smokeless tobacco: Never Used  Substance and Sexual Activity  . Alcohol use: No  . Drug use: No  . Sexual activity: Yes    Birth control/protection: None

## 2019-09-18 ENCOUNTER — Ambulatory Visit (INDEPENDENT_AMBULATORY_CARE_PROVIDER_SITE_OTHER): Payer: 59 | Admitting: Family

## 2019-09-18 ENCOUNTER — Encounter: Payer: Self-pay | Admitting: Family

## 2019-09-18 ENCOUNTER — Other Ambulatory Visit: Payer: Self-pay

## 2019-09-18 DIAGNOSIS — J069 Acute upper respiratory infection, unspecified: Secondary | ICD-10-CM | POA: Diagnosis not present

## 2019-09-18 NOTE — Progress Notes (Signed)
   Virtual Visit via telephone Note Due to COVID-19 pandemic this visit was conducted virtually. This visit type was conducted due to national recommendations for restrictions regarding the COVID-19 Pandemic (e.g. social distancing, sheltering in place) in an effort to limit this patient's exposure and mitigate transmission in our community. All issues noted in this document were discussed and addressed.  A physical exam was not performed with this format.   I connected with Shannon Moon on 09/18/19 at 12:05 pm by telephone and verified that I am speaking with the correct person using two identifiers. Shannon Moon is currently located at work and no one is currently with her during visit. The provider, Jannifer Rodney, FNP is located in their office at time of visit.  I discussed the limitations, risks, security and privacy concerns of performing an evaluation and management service by telephone and the availability of in person appointments. I also discussed with the patient that there may be a patient responsible charge related to this service. The patient expressed understanding and agreed to proceed.   History and Present Illness:  Sinusitis This is a new problem. The current episode started in the past 7 days. The problem is unchanged. There has been no fever. Her pain is at a severity of 10/10. The pain is mild. Associated symptoms include congestion, headaches and sinus pressure. Pertinent negatives include no chills, coughing, ear pain, shortness of breath, sneezing or sore throat. Past treatments include oral decongestants and spray decongestants. The treatment provided mild relief.      Review of Systems  Constitutional: Negative for chills.  HENT: Positive for congestion and sinus pressure. Negative for ear pain, sneezing and sore throat.   Respiratory: Negative for cough and shortness of breath.   Neurological: Positive for headaches.  All other systems reviewed and are  negative.    Observations/Objective: No SOB or distress noted   Assessment and Plan: 1. Viral URI Continue OTC decongestant and flonase - Take meds as prescribed - Use a cool mist humidifier  -Use saline nose sprays frequently -Force fluids -For any cough or congestion  Use plain Mucinex- regular strength or max strength is fine -For fever or aces or pains- take tylenol or ibuprofen. -Throat lozenges if help -If symptoms worsen over the next 3-5 days call office and I will send antibiotic      I discussed the assessment and treatment plan with the patient. The patient was provided an opportunity to ask questions and all were answered. The patient agreed with the plan and demonstrated an understanding of the instructions.   The patient was advised to call back or seek an in-person evaluation if the symptoms worsen or if the condition fails to improve as anticipated.  The above assessment and magement plan was discussed with the patient. The patient verbalized understanding of and has agreed to the management plan. Patient is aware to call the clinic if symptoms persist or worsen. Patient is aware when to return to the clinic for a follow-up visit. Patient educated on when it is appropriate to go to the emergency department.   Time call ended:  12:14  I provided 9 minutes of non-face-to-face time during this encounter.    Jannifer Rodney, FNP

## 2020-01-08 ENCOUNTER — Encounter: Payer: Self-pay | Admitting: Nurse Practitioner

## 2020-01-08 ENCOUNTER — Ambulatory Visit (INDEPENDENT_AMBULATORY_CARE_PROVIDER_SITE_OTHER): Payer: 59 | Admitting: Nurse Practitioner

## 2020-01-08 ENCOUNTER — Other Ambulatory Visit: Payer: Self-pay

## 2020-01-08 VITALS — BP 136/81 | HR 78 | Temp 98.4°F | Ht 62.0 in | Wt 165.8 lb

## 2020-01-08 DIAGNOSIS — M25562 Pain in left knee: Secondary | ICD-10-CM | POA: Insufficient documentation

## 2020-01-08 MED ORDER — NAPROXEN SODIUM 550 MG PO TABS: 550.0000 mg | ORAL_TABLET | Freq: Two times a day (BID) | ORAL | 0 refills | Status: DC

## 2020-01-08 MED ORDER — PREDNISONE 10 MG (21) PO TBPK: ORAL_TABLET | ORAL | 0 refills | Status: DC

## 2020-01-08 NOTE — Patient Instructions (Addendum)
Acute pain of left knee Pain in right knee is not well controlled. This is not new for patient. Pain is severe and patient is rating pain a 8 out of 10. Prior treatment has not provided therapeutic results. Patient provided education with printed handout given to patient.  started naproxen 550 mg,  Prednisone pack, advised to apply ice over knee, and elevate knee when at rest.  Labs collected today. (CRP ).  Rx sent to pharmacy   Patient knows to follow up with unresolved or worsening pain   Acute Knee Pain, Adult Many things can cause knee pain. Sometimes, knee pain is sudden (acute) and may be caused by damage, swelling, or irritation of the muscles and tissues that support your knee. The pain often goes away on its own with time and rest. If the pain does not go away, tests may be done to find out what is causing the pain. Follow these instructions at home: Pay attention to any changes in your symptoms. Take these actions to relieve your pain. If you have a knee sleeve or brace:   Wear the sleeve or brace as told by your doctor. Remove it only as told by your doctor.  Loosen the sleeve or brace if your toes: ? Tingle. ? Become numb. ? Turn cold and blue.  Keep the sleeve or brace clean.  If the sleeve or brace is not waterproof: ? Do not let it get wet. ? Cover it with a watertight covering when you take a bath or shower. Activity  Rest your knee.  Do not do things that cause pain.  Avoid activities where both feet leave the ground at the same time (high-impact activities). Examples are running, jumping rope, and doing jumping jacks.  Work with a physical therapist to make a safe exercise program, as told by your doctor. Managing pain, stiffness, and swelling   If told, put ice on the knee: ? Put ice in a plastic bag. ? Place a towel between your skin and the bag. ? Leave the ice on for 20 minutes, 2-3 times a day.  If told, put pressure (compression) on your injured  knee to control swelling, give support, and help with discomfort. Compression may be done with an elastic bandage. General instructions  Take all medicines only as told by your doctor.  Raise (elevate) your knee while you are sitting or lying down. Make sure your knee is higher than your heart.  Sleep with a pillow under your knee.  Do not use any products that contain nicotine or tobacco. These include cigarettes, e-cigarettes, and chewing tobacco. These products may slow down healing. If you need help quitting, ask your doctor.  If you are overweight, work with your doctor and a food expert (dietitian) to set goals to lose weight. Being overweight can make your knee hurt more.  Keep all follow-up visits as told by your doctor. This is important. Contact a doctor if:  The knee pain does not stop.  The knee pain changes or gets worse.  You have a fever along with knee pain.  Your knee feels warm when you touch it.  Your knee gives out or locks up. Get help right away if:  Your knee swells, and the swelling gets worse.  You cannot move your knee.  You have very bad knee pain. Summary  Many things can cause knee pain. The pain often goes away on its own with time and rest.  Your doctor may do tests to  find out the cause of the pain.  Pay attention to any changes in your symptoms. Relieve your pain with rest, medicines, light activity, and use of ice.  Get help right away if you cannot move your knee or your knee pain is very bad. This information is not intended to replace advice given to you by your health care provider. Make sure you discuss any questions you have with your health care provider. Document Revised: 01/10/2018 Document Reviewed: 01/10/2018 Elsevier Patient Education  [name].

## 2020-01-08 NOTE — Assessment & Plan Note (Addendum)
Pain in right knee is not well controlled. This is not new for patient. Pain is severe and patient is rating pain a 8 out of 10. Prior treatment has not provided therapeutic results. Patient provided education with printed handout given to patient.  started naproxen 550 mg,  Prednisone pack, advised to apply ice over knee, and elevate knee when at rest.  Labs collected today. (CRP and Arthritis panel ).  Rx sent to pharmacy   Patient knows to follow up with unresolved or worsening pain

## 2020-01-08 NOTE — Progress Notes (Signed)
Acute Office Visit  Subjective:    Patient ID: Shannon Moon, female    DOB: 20-Sep-1985, 34 y.o.   MRN: [id number]  Chief Complaint  Patient presents with  . Knee Pain    left  . Ankle Pain    Knee Pain  The incident occurred more than 1 week ago. The incident occurred at home. The injury mechanism was a fall. The pain is present in the left knee and left ankle. The pain is at a severity of 8/10. The pain has been intermittent since onset. Associated symptoms include an inability to bear weight, numbness and tingling. She reports no foreign bodies present. The symptoms are aggravated by movement and weight bearing. Treatments tried: physical therapy. The treatment provided mild relief.  Ankle Pain  Associated symptoms include an inability to bear weight, numbness and tingling.    Past Medical History:  Diagnosis Date  . Pregnancy     Past Surgical History:  Procedure Laterality Date  . ADENOIDECTOMY    . CESAREAN SECTION    . TONSILLECTOMY       Social History   Socioeconomic History  . Marital status: Married    Spouse name: Not on file  . Number of children: Not on file  . Years of education: Not on file  . Highest education level: Not on file  Occupational History  . Not on file  Tobacco Use  . Smoking status: Never Smoker  . Smokeless tobacco: Never Used  Substance and Sexual Activity  . Alcohol use: No  . Drug use: No  . Sexual activity: Yes    Birth control/protection: None  Other Topics Concern  . Not on file  Social History Narrative  . Not on file   Social Determinants of Health   Financial Resource Strain:   . Difficulty of Paying Living Expenses:   Food Insecurity:   . Worried About Programme researcher, broadcasting/film/video in the Last Year:   . Barista in the Last Year:   Transportation Needs:   . Freight forwarder (Medical):   Marland Kitchen Lack of Transportation (Non-Medical):   Physical Activity:   . Days of Exercise per Week:   . Minutes of Exercise per  Session:   Stress:   . Feeling of Stress :   Social Connections:   . Frequency of Communication with Friends and Family:   . Frequency of Social Gatherings with Friends and Family:   . Attends Religious Services:   . Active Member of Clubs or Organizations:   . Attends Banker Meetings:   Marland Kitchen Marital Status:   Intimate Partner Violence:   . Fear of Current or Ex-Partner:   . Emotionally Abused:   Marland Kitchen Physically Abused:   . Sexually Abused:     Outpatient Medications Prior to Visit  Medication Sig Dispense Refill  . etonogestrel (NEXPLANON) 68 MG IMPL implant 68 mg.     No facility-administered medications prior to visit.    No Known Allergies  Review of Systems  Constitutional: Negative.   HENT: Negative.   Eyes: Negative.   Respiratory: Negative for chest tightness and shortness of breath.   Gastrointestinal: Negative.   Genitourinary: Negative for difficulty urinating and dysuria.  Musculoskeletal: Positive for back pain, joint swelling and myalgias.  Skin: Negative for rash.  Neurological: Positive for tingling and numbness.  Psychiatric/Behavioral: The patient is not nervous/anxious.        Objective:    Physical Exam Constitutional:  Appearance: Normal appearance.  HENT:     Head: Normocephalic.  Eyes:     Conjunctiva/sclera: Conjunctivae normal.  Cardiovascular:     Rate and Rhythm: Normal rate and regular rhythm.     Pulses: Normal pulses.     Heart sounds: Normal heart sounds.  Pulmonary:     Effort: Pulmonary effort is normal.     Breath sounds: Normal breath sounds.  Abdominal:     General: Bowel sounds are normal.  Musculoskeletal:        General: Tenderness present.     Cervical back: Neck supple.     Left lower leg: Edema present.  Skin:    General: Skin is warm.  Neurological:     Mental Status: She is alert and oriented to person, place, and time.  Psychiatric:        Mood and Affect: Mood normal.        Behavior:  Behavior normal.     There were no vitals taken for this visit. Wt Readings from Last 3 Encounters:  07/10/18 173 lb (78.5 kg)  04/04/18 176 lb (79.8 kg)  01/23/18 174 lb 6.4 oz (79.1 kg)    Health Maintenance Due  Topic Date Due  . COVID-19 Vaccine (1) Never done  . HIV Screening  Never done  . PAP SMEAR-Modifier  02/09/2019      Lab Results  Component Value Date   WBC 6.4 12/02/2012   HGB 13.1 12/02/2012   HCT 38.5 12/02/2012   MCV 76.0 (A) 12/02/2012   PLT 311 05/18/2012   Lab Results  Component Value Date   NA 135 05/18/2012   K 3.2 (L) 05/18/2012   CO2 22 05/18/2012   GLUCOSE 88 05/18/2012   BUN 7 05/18/2012   CREATININE 0.50 05/18/2012   BILITOT 0.2 (L) 05/18/2012   ALKPHOS 49 05/18/2012   AST 13 05/18/2012   ALT 7 05/18/2012   PROT 6.5 05/18/2012   ALBUMIN 3.2 (L) 05/18/2012   CALCIUM 9.1 05/18/2012      Assessment & Plan:  Acute pain of left knee Pain in right knee is not well controlled. This is not new for patient. Pain is severe and patient is rating pain a 8 out of 10. Prior treatment has not provided therapeutic results. Patient provided education with printed handout given to patient.  started naproxen 550 mg,  Prednisone pack, advised to apply ice over knee, and elevate knee when at rest.  Labs collected today. (CRP and arthritis panel).  Rx sent to pharmacy   Patient knows to follow up with unresolved or worsening pain  Problem List Items Addressed This Visit      Other   Acute pain of left knee - Primary   Relevant Orders   Arthritis Panel   C-reactive protein         Ivy Lynn, NP

## 2020-01-09 ENCOUNTER — Other Ambulatory Visit: Payer: Self-pay

## 2020-01-09 DIAGNOSIS — M25562 Pain in left knee: Secondary | ICD-10-CM

## 2020-01-09 LAB — ARTHRITIS PANEL
Basophils Absolute: 0 10*3/uL (ref 0.0–0.2)
Basos: 1 %
EOS (ABSOLUTE): 0.1 10*3/uL (ref 0.0–0.4)
Eos: 1 %
Hematocrit: 41.3 % (ref 34.0–46.6)
Hemoglobin: 13.4 g/dL (ref 11.1–15.9)
Immature Grans (Abs): 0 10*3/uL (ref 0.0–0.1)
Immature Granulocytes: 0 %
Lymphocytes Absolute: 2.3 10*3/uL (ref 0.7–3.1)
Lymphs: 39 %
MCH: 26.6 pg (ref 26.6–33.0)
MCHC: 32.4 g/dL (ref 31.5–35.7)
MCV: 82 fL (ref 79–97)
Monocytes Absolute: 0.5 10*3/uL (ref 0.1–0.9)
Monocytes: 9 %
Neutrophils Absolute: 3 10*3/uL (ref 1.4–7.0)
Neutrophils: 50 %
Platelets: 307 10*3/uL (ref 150–450)
RBC: 5.03 x10E6/uL (ref 3.77–5.28)
RDW: 12.9 % (ref 11.7–15.4)
Rhuematoid fact SerPl-aCnc: <10 IU/mL (ref 0.0–13.9)
Sed Rate: 4 mm/hr (ref 0–32)
Uric Acid: 2.7 mg/dL (ref 2.6–6.2)
WBC: 6 10*3/uL (ref 3.4–10.8)

## 2020-01-09 LAB — C-REACTIVE PROTEIN: CRP: <1 mg/L (ref 0–10)

## 2020-01-21 ENCOUNTER — Telehealth: Payer: Self-pay | Admitting: Orthopaedic Surgery

## 2020-01-21 NOTE — Telephone Encounter (Signed)
Patient called.   Wants to know if she can be seen before 6/17  Call back: [phone]

## 2020-01-22 NOTE — Telephone Encounter (Signed)
We have nothing at all, she is more than welcome to call back to check for cancellations But they are out of town next week and in surgery this morning and tomorrow, that's why we are so far out

## 2020-02-02 ENCOUNTER — Ambulatory Visit: Payer: BLUE CROSS/BLUE SHIELD | Admitting: Orthopaedic Surgery

## 2020-02-04 ENCOUNTER — Telehealth: Payer: Self-pay | Admitting: Family Medicine

## 2020-02-04 NOTE — Telephone Encounter (Signed)
Patient aware to wear boot at least 2 weeks or until she sees ortho verbalized understanding.

## 2020-02-12 ENCOUNTER — Ambulatory Visit (INDEPENDENT_AMBULATORY_CARE_PROVIDER_SITE_OTHER): Payer: 59 | Admitting: Orthopaedic Surgery

## 2020-02-12 ENCOUNTER — Other Ambulatory Visit: Payer: Self-pay

## 2020-02-12 DIAGNOSIS — M25562 Pain in left knee: Secondary | ICD-10-CM

## 2020-02-12 MED ORDER — METHYLPREDNISOLONE ACETATE 40 MG/ML IJ SUSP
40.0000 mg | INTRAMUSCULAR | Status: AC | PRN
  Administered 2020-02-12: 40 mg via INTRA_ARTICULAR

## 2020-02-12 MED ORDER — LIDOCAINE HCL 1 % IJ SOLN
3.0000 mL | INTRAMUSCULAR | Status: AC | PRN
  Administered 2020-02-12: 3 mL

## 2020-02-12 NOTE — Progress Notes (Signed)
Office Visit Note   Patient: Shannon Moon           Date of Birth: 02/09/1986           MRN: [id number] Visit Date: 02/12/2020              Requested by: Daryll Drown, NP [address] [place],  [place] [address] PCP: Mechele Claude, MD   Assessment & Plan: Visit Diagnoses:  1. Acute pain of left knee     Plan: She wanted to try at least 1 more steroid injection in her left knee and I recommended that and a knee brace.  We will see her back in 4 weeks to see how she is doing overall.  If this does not help my neck step would be a diagnostic opiate therapeutic knee arthroscopy.  All questions and concerns were answered and addressed.  Follow-Up Instructions: No follow-ups on file.   Orders:  No orders of the defined types were placed in this encounter.  No orders of the defined types were placed in this encounter.     Procedures: Large Joint Inj: L knee on 02/12/2020 3:50 PM Indications: diagnostic evaluation and pain Details: 22 G 1.5 in needle, superolateral approach  Arthrogram: No  Medications: 3 mL lidocaine 1 %; 40 mg methylPREDNISolone acetate 40 MG/ML Outcome: tolerated well, no immediate complications Procedure, treatment alternatives, risks and benefits explained, specific risks discussed. Consent was given by the patient. Immediately prior to procedure a time out was called to verify the correct patient, procedure, equipment, support staff and site/side marked as required. Patient was prepped and draped in the usual sterile fashion.       Clinical Data: No additional findings.   Subjective: Chief Complaint  Patient presents with   Left Knee - Pain  The patient is well-known to me.  We saw her last year for the same symptoms of left knee pain and instability.  Her left and right knees both hyperextend.  An MRI did not show any meniscal tear and I did send her to Dr. August Saucer.  He felt like there was likely a meniscus tear that was being missed and  recommended a diagnostic arthroscopy.  There is also impingement of Hoffa's fat pad.  She has had an injection of steroid in her left knee in the past that is helped.  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  She does get swelling in her knee and it gives out on her and sometimes she falls.  It is the same knee in the same persistent problem she has had and she describes the exact same things that she did when I reviewed her notes from November 2020.  She has had no other acute change in her medical status  HPI  Review of Systems .  She currently denies any headache, chest pain, short of breath, fever, chills, nausea, vomiting  Objective: Vital Signs: There were no vitals taken for this visit.  Physical Exam She is alert and orient x3 and in no acute distress Ortho Exam Examination of her left knee shows that it does hyperextend.  She does have lateral joint line tenderness.  Her knee is painful to her.  There is no ligamentous instability on exam but her knee is hypermobile as is her right knee.  There is no effusion. Specialty Comments:  No specialty comments available.  Imaging: No results found.   PMFS History: Patient Active Problem List   Diagnosis Date Noted   Acute pain of left knee 01/08/2020   Posterior tibial tendinitis, right leg 08/16/2017   Past Medical History:  Diagnosis Date   Pregnancy     No family history on file.  Past Surgical History:  Procedure Laterality Date   ADENOIDECTOMY     CESAREAN SECTION     TONSILLECTOMY     Social History   Occupational History   Not on file  Tobacco Use   Smoking status: Never Smoker   Smokeless tobacco: Never Used  Vaping Use   Vaping Use: Never used  Substance and Sexual Activity   Alcohol use: No   Drug use: No   Sexual activity: Yes    Birth control/protection: None

## 2020-03-10 ENCOUNTER — Encounter: Payer: 59 | Admitting: Adult Health

## 2020-03-15 ENCOUNTER — Ambulatory Visit: Payer: 59 | Admitting: Orthopaedic Surgery

## 2020-03-24 ENCOUNTER — Encounter: Payer: Self-pay | Admitting: Orthopaedic Surgery

## 2020-03-24 ENCOUNTER — Ambulatory Visit (INDEPENDENT_AMBULATORY_CARE_PROVIDER_SITE_OTHER): Payer: 59 | Admitting: Orthopaedic Surgery

## 2020-03-24 DIAGNOSIS — M25562 Pain in left knee: Secondary | ICD-10-CM

## 2020-03-24 DIAGNOSIS — S838X2D Sprain of other specified parts of left knee, subsequent encounter: Secondary | ICD-10-CM | POA: Diagnosis not present

## 2020-03-24 NOTE — Progress Notes (Signed)
Patient comes in today for continued treatment of left knee pain with locking and catching.  We have tried to cut all forms of conservative treatment.  Most recently we injected her knee again with a steroid.  She does wear a hinged knee brace.  She says the knee is somewhat better since last injection but she still has pain on lateral aspect of the knee.  It does help the knee not give out with the knee brace she states.  On exam there is slight effusion of her left knee with good range of motion but painful range of motion.  Her knees hyperextend.  There is lateral joint line tenderness.  At this point I am recommending an arthroscopic intervention of the left knee with at least a partial synovectomy and potentially partial lateral meniscectomy.  However, she did inform me that she just found out she is pregnant.  With that being said we will need to delay surgery till least after her pregnancy.  She understands this as well.  She will keep [place] informed of how things are going and follow-up as needed otherwise until after her pregnancy.

## 2020-05-08 IMAGING — MR MR KNEE*L* W/O CM
4 of 7 series · 22 of 40 positions shown · non-contrast
Comparison: Report only from left knee MRI 06/18/2011.

CLINICAL DATA: Lateral knee pain and swelling for 1 month. No acute
injury or prior relevant surgery.

EXAM:
MRI OF THE LEFT KNEE WITHOUT CONTRAST
TECHNIQUE: Multiplanar, multisequence MR imaging of the knee was performed. No
intravenous contrast was administered.

[Series 3: T2 fat-sat · axial · 4.0mm · 0.50mm/px · z∈[-93,+12]mm · 5 of 22 slices shown]
[im 1/22]
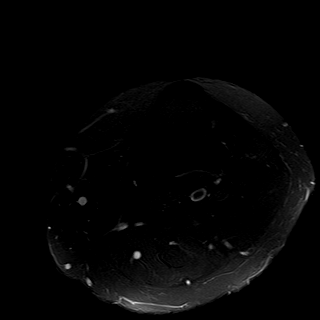
[im 6/22]
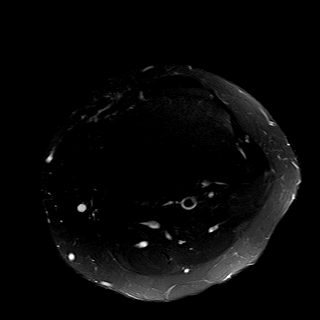
[im 11/22]
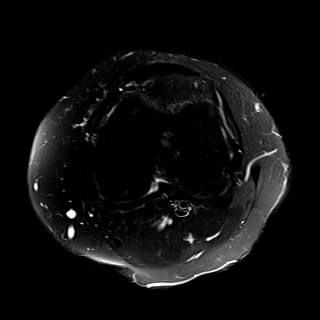
[im 16/22]
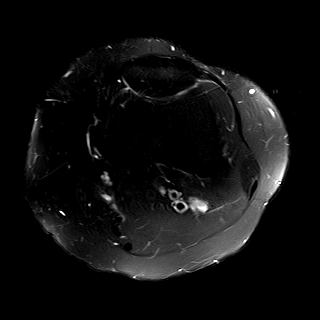
[im 22/22]
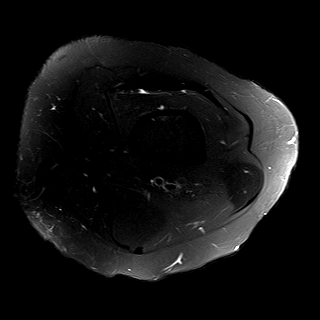

[Series 7: PD fat-sat · sagittal · 3.0mm · 0.29mm/px · 7 of 27 slices shown (1 of 3)]
[im 1/27]
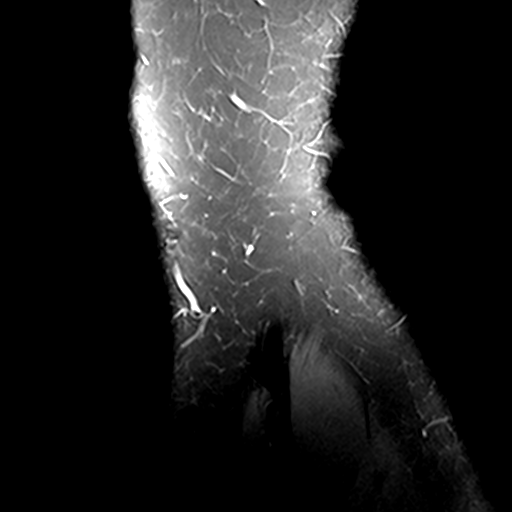
[im 5/27]
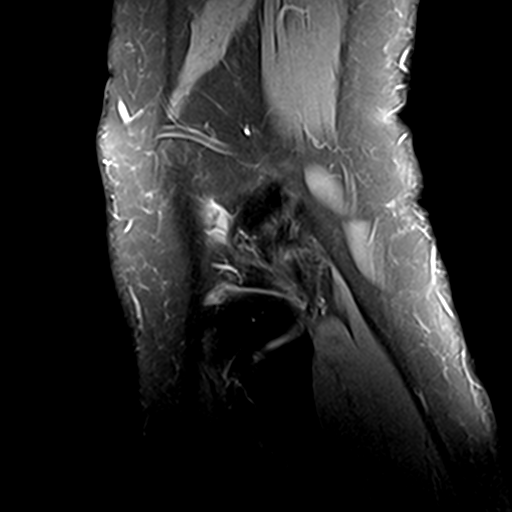
[im 9/27]
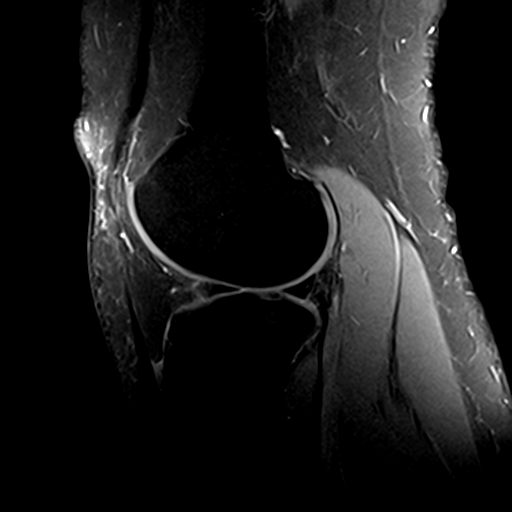
[im 14/27]
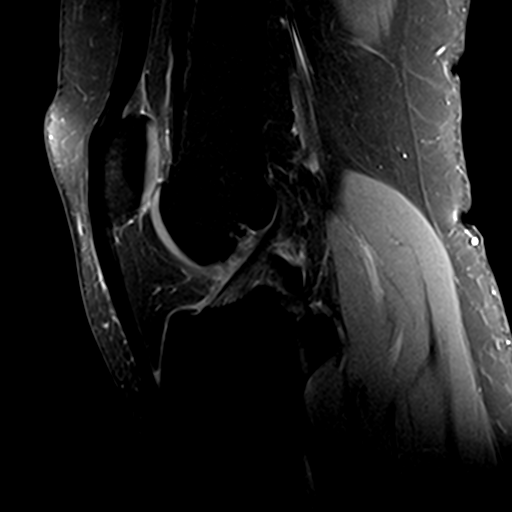
[im 18/27]
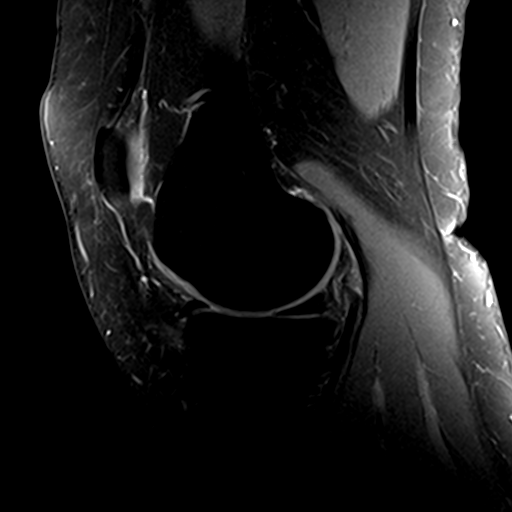
[im 22/27]
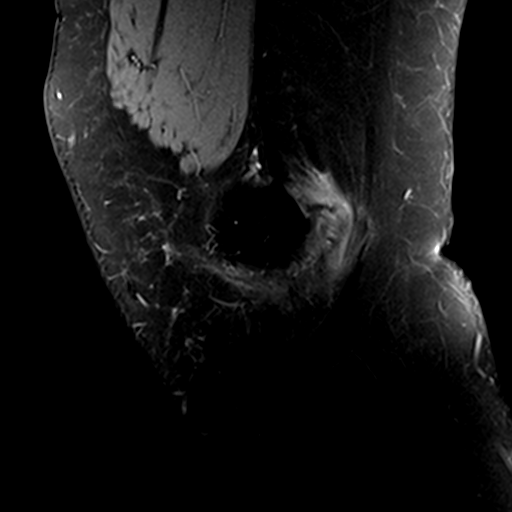
[im 27/27]
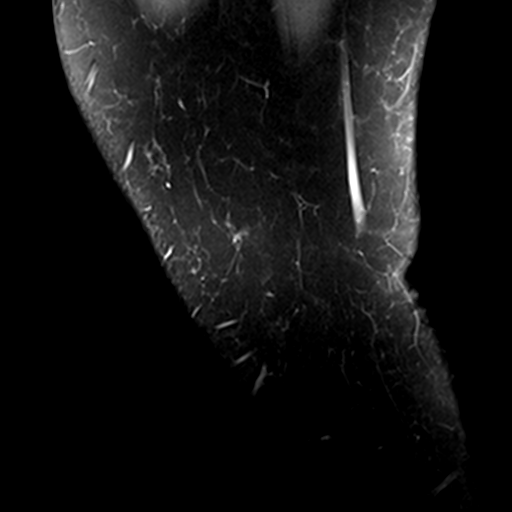

[Series 8: PD fat-sat · coronal · 3.0mm · 0.29mm/px · 7 of 28 slices shown (2 of 3)]
[im 1/28]
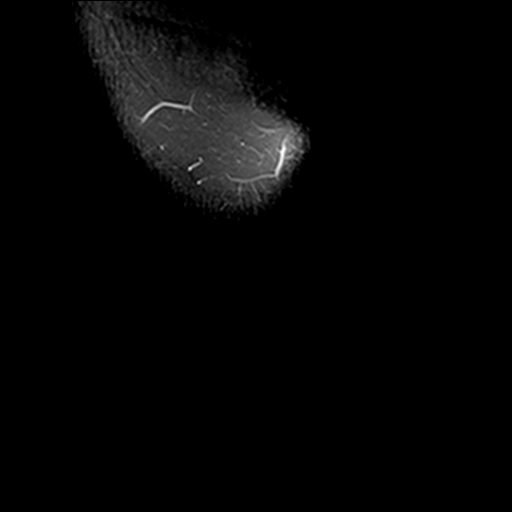
[im 5/28]
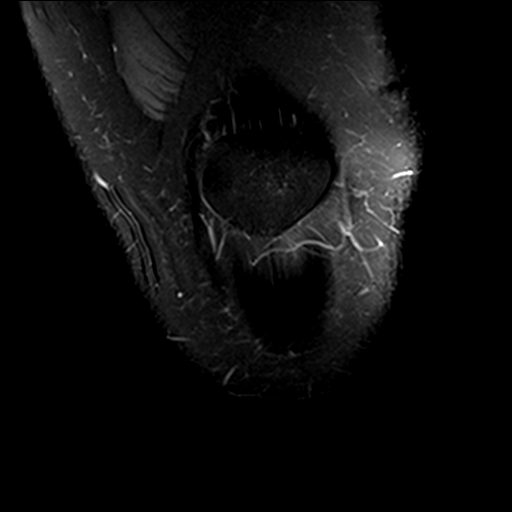
[im 10/28]
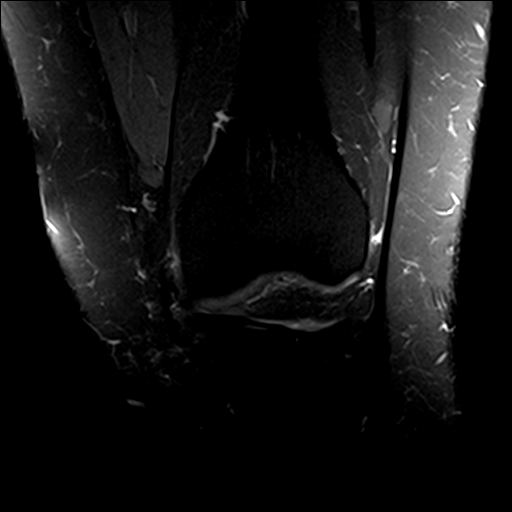
[im 14/28]
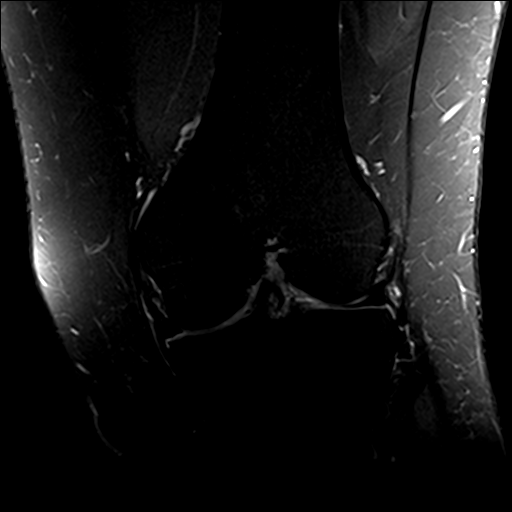
[im 19/28]
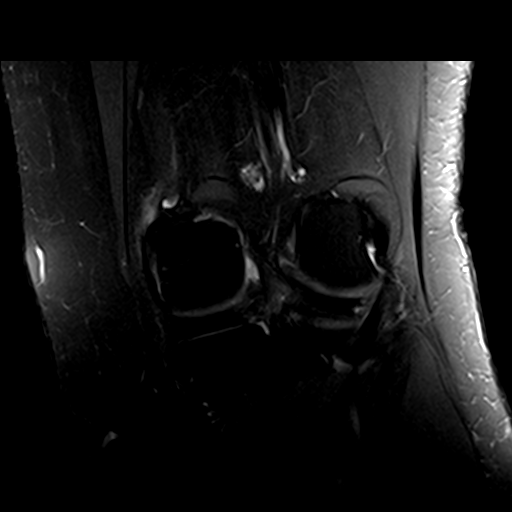
[im 23/28]
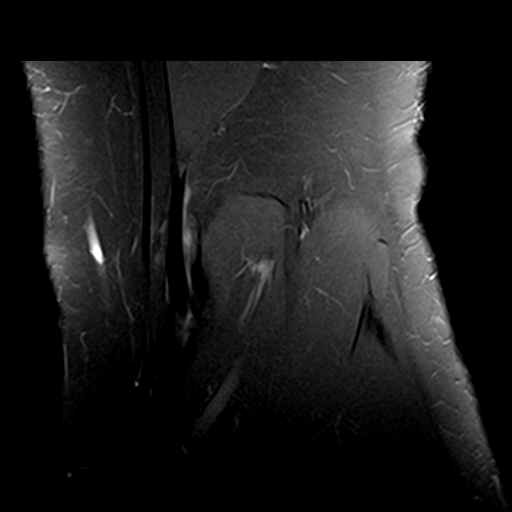
[im 28/28]
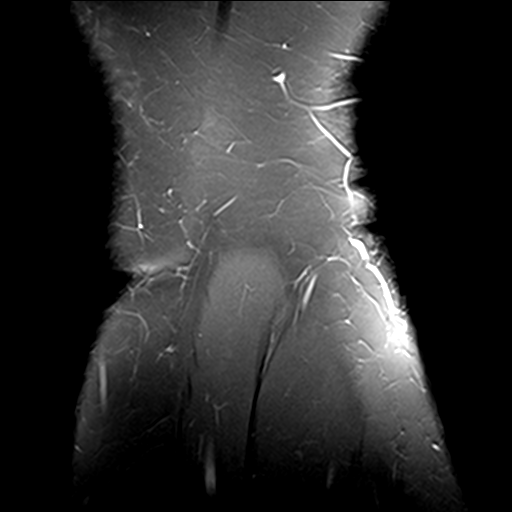

[Series 9: PD fat-sat · oblique · 2.0mm · 0.29mm/px · 3 of 11 slices shown (3 of 3)]
[im 1/11]
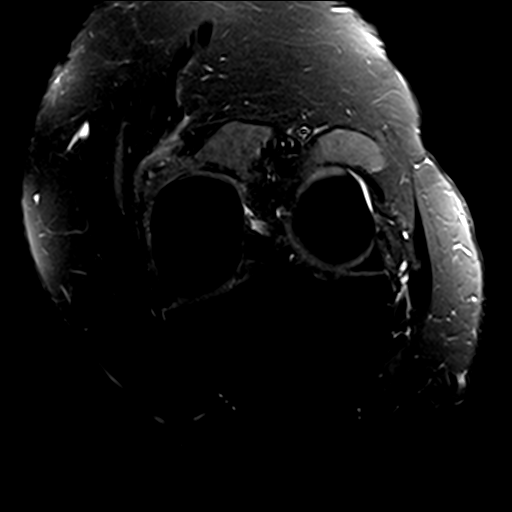
[im 6/11]
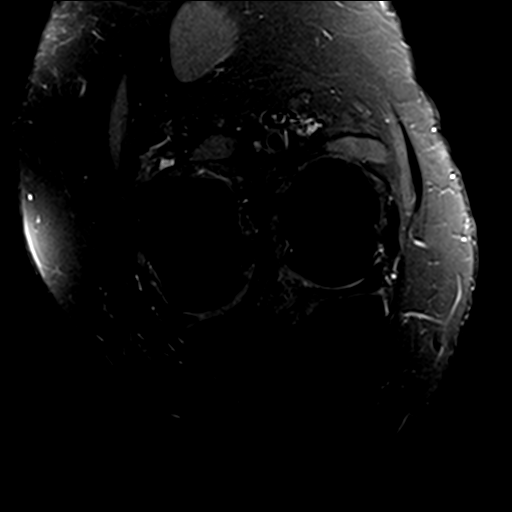
[im 11/11]
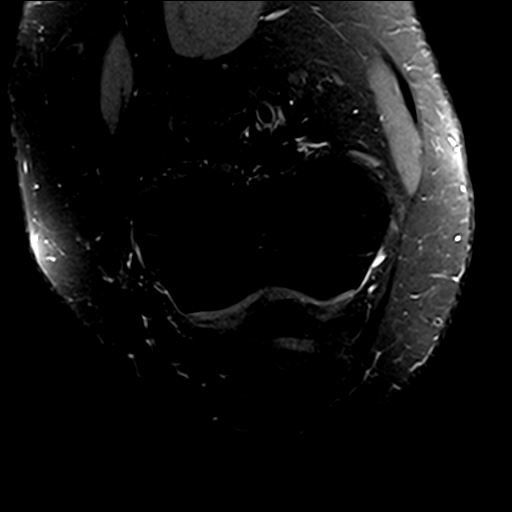

[22 of 40 positions shown; findings below may reference images not displayed]

FINDINGS: MENISCI

Medial meniscus:  Intact with normal morphology.

Lateral meniscus:  Intact with normal morphology.

LIGAMENTS

Cruciates:  Intact.

Collaterals:  Intact.

CARTILAGE

Patellofemoral: Possible mild chondromalacia of the lateral patellar
facet, best seen on the coronal and sagittal images. There is no
full-thickness chondral defect or subchondral cyst formation. The
trochlear cartilage appears intact.

Medial:  Preserved.

Lateral: Appears preserved. Minimal subchondral cyst formation in
the lateral tibial plateau.

MISCELLANEOUS

Joint:  No significant joint effusion.

Popliteal Fossa: There is a tiny Baker's cyst. Edema and small
ganglia are present along the posteromedial aspect of the distal
femur, near the insertion of the adductor magnus tendon.

Extensor Mechanism:  Intact.

Bones:  No acute or significant extra-articular osseous findings.

Other: No other significant periarticular soft tissue findings. The
lateral soft tissues appear unremarkable.
IMPRESSION: 1. No evidence of internal derangement or explanation for lateral
knee pain/swelling.
2. Possible mild inflammation and small ganglia near the insertion
of the adductor magnus tendon on the posteromedial distal femur.
3. Possible mild lateral patellar chondromalacia.
4. The menisci, cruciate and collateral ligaments appear normal.

## 2021-09-26 ENCOUNTER — Ambulatory Visit (INDEPENDENT_AMBULATORY_CARE_PROVIDER_SITE_OTHER): Payer: 59 | Admitting: Nurse Practitioner

## 2021-09-26 ENCOUNTER — Encounter: Payer: Self-pay | Admitting: Nurse Practitioner

## 2021-09-26 VITALS — BP 121/80 | HR 98 | Temp 98.1°F | Resp 20 | Ht 62.0 in | Wt 182.0 lb

## 2021-09-26 DIAGNOSIS — M25461 Effusion, right knee: Secondary | ICD-10-CM | POA: Diagnosis not present

## 2021-09-26 MED ORDER — PREDNISONE 10 MG (21) PO TBPK: ORAL_TABLET | ORAL | 0 refills | Status: DC

## 2021-09-26 NOTE — Patient Instructions (Signed)
RICE Therapy for Routine Care of Injuries Many injuries can be cared for with rest, ice, compression, and elevation (RICE therapy). This includes: Resting the injured body part. Putting ice on the injury. Putting pressure (compression) on the injury. Raising the injured part (elevation). Using RICE therapy can help to lessen pain and swelling. Supplies needed: Ice. Plastic bag. Towel. Elastic bandage. Pillow or pillows to raise your injured body part. How to care for your injury with RICE therapy Rest Try to rest the injured part of your body. You can go back to your normal activities when your doctor says it is okay to do them and when you can do themwithout pain. If you rest the injury too much, it may not heal as well. Some injuries heal better with early movement instead of resting for too long. Ask your doctor ifyou should do exercises to help your injury get better. Ice  If told, put ice on the injured area. To do this: Put ice in a plastic bag. Place a towel between your skin and the bag. Leave the ice on for 20 minutes, 2-3 times a day. Take off the ice if your skin turns bright red. This is very important. If you cannot feel pain, heat, or cold, you have a greater risk of damage to the area. Do not put ice on your bare skin. Use ice for as many days as your doctor tells you to use it.  Compression Put pressure on the injured area. This can be done with an elastic bandage. If this type of bandage has been put on your injury: Follow instructions on the package the bandage came in about how to use it. Do not wrap the bandage too tightly. Wrap the bandage more loosely if part of your body beyond the bandage is blue, swollen, cold, painful, or loses feeling. Take off the bandage and put it on again every 3-4 hours or as told by your doctor. See your doctor if the bandage seems to make your problems worse.  Elevation Raise the injured area above the level of your heart while you  are sitting orlying down. Follow these instructions at home: If your symptoms get worse or last a long time, make a follow-up appointment with your doctor. You may need to have imaging tests, such as X-rays or an MRI. If you have imaging tests, ask how to get your results when they are ready. Return to your normal activities when your doctor says that it is safe. Keep all follow-up visits. Contact a doctor if: You keep having pain and swelling. Your symptoms get worse. Get help right away if: You have sudden, very bad pain at your injury or lower than your injury. You have redness or more swelling around your injury. You have tingling or numbness at your injury or lower than your injury, and it does not go away when you take off the bandage. Summary Many injuries can be cared for using rest, ice, compression, and elevation (RICE therapy). You can go back to your normal activities when your doctor says it is okay and when you can do them without pain. Put ice on the injured area as told by your doctor. Get help if your symptoms get worse or if you keep having pain and swelling. This information is not intended to replace advice given to you by your health care provider. Make sure you discuss any questions you have with your healthcare provider. Document Revised: 05/20/2020 Document Reviewed: 05/20/2020 Elsevier Patient Education    2022 [employer] Inc[employer]  
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
## 2022-05-01 ENCOUNTER — Ambulatory Visit (INDEPENDENT_AMBULATORY_CARE_PROVIDER_SITE_OTHER): Payer: 59 | Admitting: Nurse Practitioner

## 2022-05-01 ENCOUNTER — Ambulatory Visit (INDEPENDENT_AMBULATORY_CARE_PROVIDER_SITE_OTHER): Payer: 59

## 2022-05-01 ENCOUNTER — Encounter: Payer: Self-pay | Admitting: Nurse Practitioner

## 2022-05-01 VITALS — BP 134/82 | HR 90 | Temp 98.7°F | Ht 62.0 in | Wt 182.0 lb

## 2022-05-01 DIAGNOSIS — M79671 Pain in right foot: Secondary | ICD-10-CM | POA: Diagnosis not present

## 2022-05-01 MED ORDER — METHYLPREDNISOLONE ACETATE 80 MG/ML IJ SUSP
80.0000 mg | Freq: Once | INTRAMUSCULAR | Status: AC
  Administered 2022-05-01: 80 mg via INTRAMUSCULAR

## 2022-05-01 MED ORDER — IBUPROFEN 600 MG PO TABS: 600.0000 mg | ORAL_TABLET | Freq: Three times a day (TID) | ORAL | 0 refills | Status: DC | PRN

## 2022-05-01 NOTE — Patient Instructions (Signed)

## 2022-05-01 NOTE — Progress Notes (Signed)
Acute Office Visit  Subjective:     Patient ID: Shannon Moon, female    DOB: 1986-04-25, 36 y.o.   MRN: [id number]  Chief Complaint  Patient presents with   Foot Injury    Right - fell last night and twisted foot wrong way    Foot Injury  The incident occurred 3 to 5 days ago. The incident occurred at home. The pain is present in the right foot. The quality of the pain is described as aching. The pain is at a severity of 6/10. The pain is moderate. The pain has been Constant since onset. She reports no foreign bodies present. She has tried ice for the symptoms.     Review of Systems  Constitutional: Negative.  Negative for chills and fever.  HENT: Negative.    Respiratory: Negative.    Cardiovascular: Negative.   Gastrointestinal: Negative.   Genitourinary: Negative.   Musculoskeletal:  Positive for joint pain.  Skin: Negative.  Negative for itching and rash.  All other systems reviewed and are negative.       Objective:    BP 134/82   Pulse 90   Temp 98.7 F (37.1 C)   Ht 5\' 2"  (1.575 m)   Wt 182 lb (82.6 kg)   LMP  (LMP Unknown)   SpO2 100%   BMI 33.29 kg/m  BP Readings from Last 3 Encounters:  05/01/22 134/82  09/26/21 121/80  01/08/20 136/81      Physical Exam Vitals reviewed.  Constitutional:      Appearance: Normal appearance.  HENT:     Head: Normocephalic.     Right Ear: External ear normal.     Left Ear: External ear normal.     Nose: Nose normal.     Mouth/Throat:     Mouth: Mucous membranes are moist.     Pharynx: Oropharynx is clear.  Eyes:     Conjunctiva/sclera: Conjunctivae normal.  Cardiovascular:     Rate and Rhythm: Normal rate and regular rhythm.     Pulses: Normal pulses.     Heart sounds: Normal heart sounds.  Pulmonary:     Effort: Pulmonary effort is normal.     Breath sounds: Normal breath sounds.  Abdominal:     General: Bowel sounds are normal.  Musculoskeletal:     Right foot: Decreased range of motion.  Tenderness present.  Skin:    General: Skin is warm.     Findings: No rash.  Neurological:     General: No focal deficit present.     Mental Status: She is alert and oriented to person, place, and time.     No results found for any visits on 05/01/22.      Assessment & Plan:  Right ankle pain not well controlled in the past 24 hours.  Advised patient to apply ice, rest ankle, Ace wrap, 80 Depo-Medrol shot given in clinic.  Ibuprofen 600 mg tablet as needed for pain.  Follow-up with worsening or unresolved symptoms. Problem List Items Addressed This Visit   None Visit Diagnoses     Right foot pain    -  Primary   Relevant Medications   methylPREDNISolone acetate (DEPO-MEDROL) injection 80 mg   ibuprofen (ADVIL) 600 MG tablet   Other Relevant Orders   DG Foot Complete Right       Meds ordered this encounter  Medications   methylPREDNISolone acetate (DEPO-MEDROL) injection 80 mg   ibuprofen (ADVIL) 600 MG tablet    Sig:  Take 1 tablet (600 mg total) by mouth every 8 (eight) hours as needed.    Dispense:  30 tablet    Refill:  0    Order Specific Question:   Supervising Provider    Answer:   Mechele Claude [phone]    Return if symptoms worsen or fail to improve.  Daryll Drown, NP

## 2022-12-04 ENCOUNTER — Encounter: Payer: Self-pay | Admitting: Family Medicine

## 2022-12-04 ENCOUNTER — Ambulatory Visit (INDEPENDENT_AMBULATORY_CARE_PROVIDER_SITE_OTHER): Payer: 59 | Admitting: Family Medicine

## 2022-12-04 VITALS — BP 127/83 | HR 84 | Temp 98.3°F | Ht 62.0 in | Wt 183.8 lb

## 2022-12-04 DIAGNOSIS — R079 Chest pain, unspecified: Secondary | ICD-10-CM

## 2022-12-04 MED ORDER — PANTOPRAZOLE SODIUM 40 MG PO TBEC: 40.0000 mg | DELAYED_RELEASE_TABLET | Freq: Every day | ORAL | 11 refills | Status: DC

## 2022-12-04 MED ORDER — BETAMETHASONE SOD PHOS & ACET 6 (3-3) MG/ML IJ SUSP
6.0000 mg | Freq: Once | INTRAMUSCULAR | Status: AC
  Administered 2022-12-04: 6 mg via INTRAMUSCULAR

## 2022-12-04 NOTE — Patient Instructions (Signed)
Discontinue the muscle relaxer.

## 2022-12-04 NOTE — Progress Notes (Signed)
Subjective:  Patient ID: Shannon Moon, female    DOB: 1986/03/28  Age: 37 y.o. MRN: [id number]  CC: Chest Pain   HPI Shannon Moon presents for left substernal chest pain, sharp. Onset upon awakening 4 days ago. Hurts with every little movement.. Was nauseous  first day only. Eating felt like it was getting hung in Shannon Moon chest, but just Day 1. Lsats a second or two ,b ut very frequent. Feeling really tired since. Had EKG at [facility] and put on muscle relaxer. Not helping.      12/04/2022    9:45 AM 05/01/2022    3:53 PM 01/08/2020    2:00 PM  Depression screen PHQ 2/9  Decreased Interest 0 0 0  Down, Depressed, Hopeless 0 0 0  PHQ - 2 Score 0 0 0  Altered sleeping   0  Tired, decreased energy   0  Change in appetite   0  Feeling bad or failure about yourself    0  Trouble concentrating   0  Moving slowly or fidgety/restless   0  Suicidal thoughts   0  PHQ-9 Score   0    History Shannon Moon has a past medical history of Pregnancy.   Shannon Moon has a past surgical history that includes Cesarean section; Tonsillectomy; and Adenoidectomy.   Shannon Moon family history is not on file.Shannon Moon reports that Shannon Moon has never smoked. Shannon Moon has never used smokeless tobacco. Shannon Moon reports that Shannon Moon does not drink alcohol and does not use drugs.    ROS Review of Systems  Constitutional: Negative.   HENT: Negative.    Eyes:  Negative for visual disturbance.  Respiratory:  Negative for shortness of breath.   Cardiovascular:  Positive for chest pain.  Gastrointestinal:  Negative for abdominal pain.  Musculoskeletal:  Negative for arthralgias.    Objective:  BP 127/83   Pulse 84   Temp 98.3 F (36.8 C)   Ht  (1.575 m)   Wt 183 lb 12.8 oz (83.4 kg)   SpO2 100%   BMI 33.62 kg/m   BP Readings from Last 3 Encounters:  12/04/22 127/83  05/01/22 134/82  09/26/21 121/80    Wt Readings from Last 3 Encounters:  12/04/22 183 lb 12.8 oz (83.4 kg)  05/01/22 182 lb (82.6 kg)  09/26/21 182 lb (82.6 kg)      Physical Exam Constitutional:      General: Shannon Moon is not in acute distress.    Appearance: Shannon Moon is well-developed.  Cardiovascular:     Rate and Rhythm: Normal rate and regular rhythm.  Pulmonary:     Breath sounds: Normal breath sounds.  Musculoskeletal:        General: Normal range of motion.  Skin:    General: Skin is warm and dry.  Neurological:     Mental Status: Shannon Moon is alert and oriented to person, place, and time.     EKG - no ischemic changes, NSR. Nml indices  Assessment & Plan:   Shannon Moon was seen today for chest pain.  Diagnoses and all orders for this visit:  Chest pain, unspecified type -     EKG 12-Lead -     CBC with Differential/Platelet -     CMP14+EGFR -     Lipase -     betamethasone acetate-betamethasone sodium phosphate (CELESTONE) injection 6 mg -     hCG, serum, qualitative  Other orders -     pantoprazole (PROTONIX) 40 MG tablet; Take 1 tablet (40 mg total)  by mouth daily. For stomach       I have discontinued Shannon Moon's ibuprofen. I am also having Shannon Moon start on pantoprazole. Additionally, I am having Shannon Moon maintain Shannon Moon levonorgestrel-ethinyl estradiol. We administered betamethasone acetate-betamethasone sodium phosphate.  Allergies as of 12/04/2022   No Known Allergies      Medication List        Accurate as of December 04, 2022  5:46 PM. If you have any questions, ask your nurse or doctor.          STOP taking these medications    ibuprofen 600 MG tablet Commonly known as: ADVIL Stopped by: Mechele Claude, MD       TAKE these medications    levonorgestrel-ethinyl estradiol 0.15-30 MG-MCG tablet Commonly known as: NORDETTE Take 1 tablet by mouth daily.   pantoprazole 40 MG tablet Commonly known as: PROTONIX Take 1 tablet (40 mg total) by mouth daily. For stomach Started by: Mechele Claude, MD         Follow-up: Return if symptoms worsen or fail to improve.  Mechele Claude, M.D.

## 2022-12-05 LAB — CBC WITH DIFFERENTIAL/PLATELET
Basophils Absolute: 0 10*3/uL (ref 0.0–0.2)
Basos: 1 %
EOS (ABSOLUTE): 0.1 10*3/uL (ref 0.0–0.4)
Eos: 2 %
Hematocrit: 40.7 % (ref 34.0–46.6)
Hemoglobin: 13.2 g/dL (ref 11.1–15.9)
Immature Grans (Abs): 0 10*3/uL (ref 0.0–0.1)
Immature Granulocytes: 0 %
Lymphocytes Absolute: 2.2 10*3/uL (ref 0.7–3.1)
Lymphs: 37 %
MCH: 26.2 pg — ABNORMAL LOW (ref 26.6–33.0)
MCHC: 32.4 g/dL (ref 31.5–35.7)
MCV: 81 fL (ref 79–97)
Monocytes Absolute: 0.5 10*3/uL (ref 0.1–0.9)
Monocytes: 8 %
Neutrophils Absolute: 3.1 10*3/uL (ref 1.4–7.0)
Neutrophils: 52 %
Platelets: 329 10*3/uL (ref 150–450)
RBC: 5.04 x10E6/uL (ref 3.77–5.28)
RDW: 12.6 % (ref 11.7–15.4)
WBC: 5.9 10*3/uL (ref 3.4–10.8)

## 2022-12-05 LAB — CMP14+EGFR
ALT: 7 IU/L (ref 0–32)
AST: 13 IU/L (ref 0–40)
Albumin/Globulin Ratio: 2 (ref 1.2–2.2)
Albumin: 4.5 g/dL (ref 3.9–4.9)
Alkaline Phosphatase: 47 IU/L (ref 44–121)
BUN/Creatinine Ratio: 9 (ref 9–23)
BUN: 8 mg/dL (ref 6–20)
Bilirubin Total: 0.3 mg/dL (ref 0.0–1.2)
CO2: 21 mmol/L (ref 20–29)
Calcium: 9.6 mg/dL (ref 8.7–10.2)
Chloride: 105 mmol/L (ref 96–106)
Creatinine, Ser: 0.88 mg/dL (ref 0.57–1.00)
Globulin, Total: 2.2 g/dL (ref 1.5–4.5)
Glucose: 91 mg/dL (ref 70–99)
Potassium: 4.7 mmol/L (ref 3.5–5.2)
Sodium: 142 mmol/L (ref 134–144)
Total Protein: 6.7 g/dL (ref 6.0–8.5)
eGFR: 87 mL/min/{1.73_m2} (ref >59)

## 2022-12-05 LAB — HCG, SERUM, QUALITATIVE: hCG,Beta Subunit,Qual,Serum: NEGATIVE m[IU]/mL (ref <6)

## 2022-12-05 LAB — LIPASE: Lipase: 30 U/L (ref 14–72)

## 2022-12-05 NOTE — Progress Notes (Signed)
Hello Lockie,  Your lab result is normal and/or stable.Some minor variations that are not significant are commonly marked abnormal, but do not represent any medical problem for you.  Best regards, Mechele Claude, M.D.

## 2022-12-06 ENCOUNTER — Telehealth: Payer: Self-pay | Admitting: Family Medicine

## 2022-12-06 NOTE — Telephone Encounter (Signed)
PATIENT AWARE

## 2022-12-06 NOTE — Telephone Encounter (Signed)
Pt wants to know if she should still be taking pantoprazole (PROTONIX) 40 MG tablet. She is asking due to recent lab results. She says that the rx is working. Please call back

## 2022-12-06 NOTE — Telephone Encounter (Signed)
Yes, continue the medication, a minimum of 2 months

## 2022-12-21 ENCOUNTER — Encounter: Payer: Self-pay | Admitting: Family Medicine

## 2022-12-26 ENCOUNTER — Ambulatory Visit (INDEPENDENT_AMBULATORY_CARE_PROVIDER_SITE_OTHER): Payer: 59 | Admitting: Family Medicine

## 2022-12-26 ENCOUNTER — Encounter: Payer: Self-pay | Admitting: Family Medicine

## 2022-12-26 VITALS — BP 136/97 | HR 94 | Temp 98.1°F | Ht 62.0 in | Wt 183.8 lb

## 2022-12-26 DIAGNOSIS — E669 Obesity, unspecified: Secondary | ICD-10-CM

## 2022-12-26 DIAGNOSIS — Z6833 Body mass index (BMI) 33.0-33.9, adult: Secondary | ICD-10-CM | POA: Diagnosis not present

## 2022-12-26 MED ORDER — PHENTERMINE HCL 37.5 MG PO CAPS: 37.5000 mg | ORAL_CAPSULE | ORAL | 5 refills | Status: DC

## 2022-12-26 NOTE — Progress Notes (Signed)
Subjective:  Patient ID: Shannon Moon, female    DOB: 06/20/1986  Age: 37 y.o. MRN: [id number]  CC: Obesity   HPI HARINDER ISKANDER presents for concern for inability to lose weight. Has tried various programs, increasing water, meal prep, exercise, DC sodas, etc. No weight loss achieved. Wants help with it.      12/26/2022    4:03 PM 12/04/2022    9:45 AM 05/01/2022    3:53 PM  Depression screen PHQ 2/9  Decreased Interest 0 0 0  Down, Depressed, Hopeless 0 0 0  PHQ - 2 Score 0 0 0    History Canyon has a past medical history of Pregnancy.   She has a past surgical history that includes Cesarean section; Tonsillectomy; and Adenoidectomy.   Her family history is not on file.She reports that she has never smoked. She has never used smokeless tobacco. She reports that she does not drink alcohol and does not use drugs.    ROS Review of Systems  Constitutional: Negative.   HENT: Negative.    Eyes:  Negative for visual disturbance.  Respiratory:  Negative for shortness of breath.   Cardiovascular:  Negative for chest pain and palpitations.  Gastrointestinal:  Negative for abdominal pain.  Musculoskeletal:  Negative for arthralgias.    Objective:  BP (!) 136/97   Pulse 94   Temp 98.1 F (36.7 C)   Ht 5\' 2"  (1.575 m)   Wt 183 lb 12.8 oz (83.4 kg)   SpO2 100%   BMI 33.62 kg/m   BP Readings from Last 3 Encounters:  12/26/22 (!) 136/97  12/04/22 127/83  05/01/22 134/82    Wt Readings from Last 3 Encounters:  12/26/22 183 lb 12.8 oz (83.4 kg)  12/04/22 183 lb 12.8 oz (83.4 kg)  05/01/22 182 lb (82.6 kg)     Physical Exam Constitutional:      General: She is not in acute distress.    Appearance: She is well-developed.  Cardiovascular:     Rate and Rhythm: Normal rate and regular rhythm.  Pulmonary:     Breath sounds: Normal breath sounds.  Musculoskeletal:        General: Normal range of motion.  Skin:    General: Skin is warm and dry.  Neurological:      Mental Status: She is alert and oriented to person, place, and time.       Assessment & Plan:   [place] was seen today for obesity.  Diagnoses and all orders for this visit:  Class 1 obesity without serious comorbidity with body mass index (BMI) of 33.0 to 33.9 in adult, unspecified obesity type  Other orders -     phentermine 37.5 MG capsule; Take 1 capsule (37.5 mg total) by mouth every morning.       I am having Sobia Y. Yochim start on phentermine. I am also having her maintain her levonorgestrel-ethinyl estradiol and pantoprazole.  Allergies as of 12/26/2022   No Known Allergies      Medication List        Accurate as of Dec 26, 2022  4:56 PM. If you have any questions, ask your nurse or doctor.          levonorgestrel-ethinyl estradiol 0.15-30 MG-MCG tablet Commonly known as: NORDETTE Take 1 tablet by mouth daily.   pantoprazole 40 MG tablet Commonly known as: PROTONIX Take 1 tablet (40 mg total) by mouth daily. For stomach   phentermine 37.5 MG capsule Take 1 capsule (  37.5 mg total) by mouth every morning. Started by: Mechele Claude, MD         Follow-up: Return in about 3 months (around 03/28/2023).  Mechele Claude, M.D.

## 2022-12-27 ENCOUNTER — Telehealth: Payer: Self-pay

## 2022-12-27 NOTE — Telephone Encounter (Signed)
Shannon Moon (Key: [id number]) PA Case ID #: [id number] Rx #: [id number] Need Help? Call us at [phone] Status sent iconSent to Plan today Drug Phentermine HCl 37.5MG  capsules ePA cloud logo Form [facility] Electronic Prior Authorization Form [phone] NCPDP)

## 2023-01-01 NOTE — Telephone Encounter (Signed)
Estellene Sanmiguel (Key: [id number]) PA Case ID #: [id number] Rx #: [id number] Need Help? Call us at [phone] Outcome Approved on May 15 Request Reference Number: [id number]. PHENTERMINE CAP 37.5MG  is approved through 06/29/2023. Your patient may now fill this prescription and it will be covered. Authorization Expiration Date: 06/29/2023 Drug Phentermine HCl 37.5MG  capsules ePA cloud logo Form [facility] Electronic Prior Authorization Form [phone] NCPDP)  Pharmacy informed

## 2023-03-28 ENCOUNTER — Ambulatory Visit (INDEPENDENT_AMBULATORY_CARE_PROVIDER_SITE_OTHER): Payer: 59 | Admitting: Family Medicine

## 2023-03-28 ENCOUNTER — Encounter: Payer: Self-pay | Admitting: Family Medicine

## 2023-03-28 VITALS — BP 138/86 | HR 98 | Temp 97.5°F | Ht 62.0 in | Wt 171.2 lb

## 2023-03-28 DIAGNOSIS — E669 Obesity, unspecified: Secondary | ICD-10-CM | POA: Diagnosis not present

## 2023-03-28 DIAGNOSIS — Z6833 Body mass index (BMI) 33.0-33.9, adult: Secondary | ICD-10-CM | POA: Diagnosis not present

## 2023-03-28 NOTE — Progress Notes (Signed)
Subjective:  Patient ID: Shannon Moon, female    DOB: 05-30-1986  Age: 37 y.o. MRN: [id number]  CC: Medical Management of Chronic Issues   HPI EVEA STANCO presents for weight loss of 12 lb in 3 mos while taking phentermine. Denies any side effects including palpitations chest pain, loss of sleep and jitteriness.      03/28/2023    3:06 PM 12/26/2022    4:03 PM 12/04/2022    9:45 AM  Depression screen PHQ 2/9  Decreased Interest 0 0 0  Down, Depressed, Hopeless 0 0 0  PHQ - 2 Score 0 0 0    History Nayra has a past medical history of Pregnancy.   She has a past surgical history that includes Cesarean section; Tonsillectomy; and Adenoidectomy.   Her family history is not on file.She reports that she has never smoked. She has never used smokeless tobacco. She reports that she does not drink alcohol and does not use drugs.    ROS Review of Systems  Constitutional: Negative.   HENT: Negative.    Eyes:  Negative for visual disturbance.  Respiratory:  Negative for shortness of breath.   Cardiovascular:  Negative for chest pain.  Gastrointestinal:  Negative for abdominal pain.  Musculoskeletal:  Negative for arthralgias.    Objective:  BP 138/86   Pulse 98   Temp (!) 97.5 F (36.4 C)   Ht 5\' 2"  (1.575 m)   Wt 171 lb 3.2 oz (77.7 kg)   SpO2 98%   BMI 31.31 kg/m   BP Readings from Last 3 Encounters:  03/28/23 138/86  12/26/22 (!) 136/97  12/04/22 127/83    Wt Readings from Last 3 Encounters:  03/28/23 171 lb 3.2 oz (77.7 kg)  12/26/22 183 lb 12.8 oz (83.4 kg)  12/04/22 183 lb 12.8 oz (83.4 kg)     Physical Exam Constitutional:      General: She is not in acute distress.    Appearance: She is well-developed.  Cardiovascular:     Rate and Rhythm: Normal rate and regular rhythm.  Pulmonary:     Breath sounds: Normal breath sounds.  Musculoskeletal:        General: Normal range of motion.  Skin:    General: Skin is warm and dry.  Neurological:      Mental Status: She is alert and oriented to person, place, and time.       Assessment & Plan:   Maat was seen today for medical management of chronic issues.  Diagnoses and all orders for this visit:  Class 1 obesity without serious comorbidity with body mass index (BMI) of 33.0 to 33.9 in adult, unspecified obesity type       I have discontinued Azuri Y. Phoenix's pantoprazole. I am also having her maintain her levonorgestrel-ethinyl estradiol and phentermine.  Allergies as of 03/28/2023   No Known Allergies      Medication List        Accurate as of March 28, 2023  5:01 PM. If you have any questions, ask your nurse or doctor.          STOP taking these medications    pantoprazole 40 MG tablet Commonly known as: PROTONIX Stopped by:         TAKE these medications    levonorgestrel-ethinyl estradiol 0.15-30 MG-MCG tablet Commonly known as: NORDETTE Take 1 tablet by mouth daily.   phentermine 37.5 MG capsule Take 1 capsule (37.5 mg total) by mouth every morning.  Follow-up: Return in about 6 months (around 09/28/2023), or if symptoms worsen or fail to improve.  Mechele Claude, M.D.

## 2023-04-17 ENCOUNTER — Encounter: Payer: Self-pay | Admitting: Family Medicine

## 2023-04-17 ENCOUNTER — Ambulatory Visit (INDEPENDENT_AMBULATORY_CARE_PROVIDER_SITE_OTHER): Payer: 59 | Admitting: Family Medicine

## 2023-04-17 VITALS — BP 119/81 | HR 99 | Temp 97.4°F | Ht 62.0 in | Wt 172.8 lb

## 2023-04-17 DIAGNOSIS — L249 Irritant contact dermatitis, unspecified cause: Secondary | ICD-10-CM | POA: Diagnosis not present

## 2023-04-17 MED ORDER — BETAMETHASONE SOD PHOS & ACET 6 (3-3) MG/ML IJ SUSP
6.0000 mg | Freq: Once | INTRAMUSCULAR | Status: AC
Start: 2023-04-17 — End: 2023-04-17
  Administered 2023-04-17: 6 mg via INTRAMUSCULAR

## 2023-04-17 NOTE — Progress Notes (Signed)
   Subjective:  Patient ID: Shannon Moon, female    DOB: 11-23-1985  Age: 37 y.o. MRN: [id number]  CC: Allergic Reaction and Rash   HPI Shannon Moon presents for 2 weeks of rash all over arms and trunk. Has been pruritic. Taking phentermine for several months. Breasts are itching and burning. Had some lactation last night.      03/28/2023    3:06 PM 12/26/2022    4:03 PM 12/04/2022    9:45 AM  Depression screen PHQ 2/9  Decreased Interest 0 0 0  Down, Depressed, Hopeless 0 0 0  PHQ - 2 Score 0 0 0    History Shannon Moon has a past medical history of Pregnancy.   Shannon Moon has a past surgical history that includes Cesarean section; Tonsillectomy; and Adenoidectomy.   Her family history is not on file.Shannon Moon reports that Shannon Moon has never smoked. Shannon Moon has never used smokeless tobacco. Shannon Moon reports that Shannon Moon does not drink alcohol and does not use drugs.    ROS Review of Systems  Objective:  BP 119/81   Pulse 99   Temp (!) 97.4 F (36.3 C)   Ht 5\' 2"  (1.575 m)   Wt 172 lb 12.8 oz (78.4 kg)   SpO2 100%   BMI 31.61 kg/m   BP Readings from Last 3 Encounters:  04/17/23 119/81  03/28/23 138/86  12/26/22 (!) 136/97    Wt Readings from Last 3 Encounters:  04/17/23 172 lb 12.8 oz (78.4 kg)  03/28/23 171 lb 3.2 oz (77.7 kg)  12/26/22 183 lb 12.8 oz (83.4 kg)     Physical Exam Constitutional:      General: Shannon Moon is not in acute distress.    Appearance: Normal appearance.  Cardiovascular:     Rate and Rhythm: Normal rate and regular rhythm.     Heart sounds: Normal heart sounds.  Pulmonary:     Breath sounds: Normal breath sounds.  Skin:    Findings: Rash (blanching erythema at lower chest, forearms, base of neck and back.) present.  Neurological:     Mental Status: Shannon Moon is alert.       Assessment & Plan:   Shannon Moon was seen today for allergic reaction and rash.  Diagnoses and all orders for this visit:  Irritant contact dermatitis, unspecified trigger -     betamethasone  acetate-betamethasone sodium phosphate (CELESTONE) injection 6 mg       I am having [name] maintain her levonorgestrel-ethinyl estradiol and phentermine. We will continue to administer betamethasone acetate-betamethasone sodium phosphate.  Allergies as of 04/17/2023   No Known Allergies      Medication List        Accurate as of April 17, 2023  4:16 PM. If you have any questions, ask your nurse or doctor.          levonorgestrel-ethinyl estradiol 0.15-30 MG-MCG tablet Commonly known as: NORDETTE Take 1 tablet by mouth daily.   phentermine 37.5 MG capsule Take 1 capsule (37.5 mg total) by mouth every morning.         Follow-up: Return if symptoms worsen or fail to improve.  Mechele Claude, M.D.

## 2023-04-22 ENCOUNTER — Encounter: Payer: Self-pay | Admitting: Family Medicine

## 2023-04-25 ENCOUNTER — Ambulatory Visit (INDEPENDENT_AMBULATORY_CARE_PROVIDER_SITE_OTHER): Payer: 59 | Admitting: Family Medicine

## 2023-04-25 ENCOUNTER — Encounter: Payer: Self-pay | Admitting: Family Medicine

## 2023-04-25 VITALS — BP 132/92 | HR 102 | Temp 98.1°F | Ht 62.0 in | Wt 173.4 lb

## 2023-04-25 DIAGNOSIS — Z803 Family history of malignant neoplasm of breast: Secondary | ICD-10-CM | POA: Diagnosis not present

## 2023-04-25 DIAGNOSIS — N6315 Unspecified lump in the right breast, overlapping quadrants: Secondary | ICD-10-CM | POA: Diagnosis not present

## 2023-04-25 DIAGNOSIS — L2989 Other pruritus: Secondary | ICD-10-CM

## 2023-04-25 DIAGNOSIS — N6325 Unspecified lump in the left breast, overlapping quadrants: Secondary | ICD-10-CM

## 2023-04-25 DIAGNOSIS — L298 Other pruritus: Secondary | ICD-10-CM

## 2023-04-25 MED ORDER — PREDNISONE 20 MG PO TABS
40.0000 mg | ORAL_TABLET | Freq: Every day | ORAL | 0 refills | Status: AC
Start: 2023-04-25 — End: 2023-04-30

## 2023-04-25 MED ORDER — DOXYCYCLINE HYCLATE 100 MG PO TABS
100.0000 mg | ORAL_TABLET | Freq: Two times a day (BID) | ORAL | 0 refills | Status: AC
Start: 2023-04-25 — End: 2023-05-05

## 2023-04-25 NOTE — Progress Notes (Signed)
Subjective:  Patient ID: Shannon Moon, female    DOB: [birth year], 37 y.o.   MRN: [id number]  Patient Care Team: Mechele Claude, MD as PCP - General (Family Medicine)   Chief Complaint:  breat knots (X 3 days bilateral )   HPI: Shannon Moon is a 37 y.o. female presenting on 04/25/2023 for breat knots (X 3 days bilateral )   Pt presents today for recurrent rash to torso and upper arms. States she has now developed knots and pain in her breasts. She states the knots have appeared over the last several weeks. Her mother did have breast cancer.   Rash The current episode started 1 to 4 weeks ago. The problem has been waxing and waning since onset. The affected locations include the chest, torso, right arm, right axilla, left arm and left axilla. The rash is characterized by redness, pain and itchiness. She was exposed to nothing. Pertinent negatives include no fatigue or fever. Treatments tried: steroid shot. The treatment provided no relief.       Relevant past medical, surgical, family, and social history reviewed and updated as indicated.  Allergies and medications reviewed and updated. Data reviewed: Chart in Epic.   Past Medical History:  Diagnosis Date   Pregnancy     Past Surgical History:  Procedure Laterality Date   ADENOIDECTOMY     CESAREAN SECTION     TONSILLECTOMY      Social History   Socioeconomic History   Marital status: Married    Spouse name: Not on file   Number of children: Not on file   Years of education: Not on file   Highest education level: Not on file  Occupational History   Not on file  Tobacco Use   Smoking status: Never   Smokeless tobacco: Never  Vaping Use   Vaping status: Never Used  Substance and Sexual Activity   Alcohol use: No   Drug use: No   Sexual activity: Yes    Birth control/protection: None  Other Topics Concern   Not on file  Social History Narrative   Not on file   Social Determinants of Health    Financial Resource Strain: Not on file  Food Insecurity: Not on file  Transportation Needs: No Transportation Needs (03/24/2019)   Received from [facility], [facility]   [facility] - Transportation    Lack of Transportation (Medical): No    Lack of Transportation (Non-Medical): No  Physical Activity: Not on file  Stress: No Stress Concern Present (03/24/2019)   Received from [facility], [facility] - Occupational Stress Questionnaire    Feeling of Stress : Not at all  Social Connections: Not on file  Intimate Partner Violence: Not At Risk (04/14/2022)   Received from [facility], [facility]   Humiliation, Afraid, Rape, and Kick questionnaire    Fear of Current or Ex-Partner: No    Emotionally Abused: No    Physically Abused: No    Sexually Abused: No    Outpatient Encounter Medications as of 04/25/2023  Medication Sig   doxycycline (VIBRA-TABS) 100 MG tablet Take 1 tablet (100 mg total) by mouth 2 (two) times daily for 10 days. 1 po bid   phentermine 37.5 MG capsule Take 1 capsule (37.5 mg total) by mouth every morning.   predniSONE (DELTASONE) 20 MG tablet Take 2 tablets (40 mg total) by mouth daily with breakfast for  5 days.   levonorgestrel-ethinyl estradiol (NORDETTE) 0.15-30 MG-MCG tablet Take 1 tablet by mouth daily.   No facility-administered encounter medications on file as of 04/25/2023.    No Known Allergies  Review of Systems  Constitutional:  Negative for activity change, appetite change, chills, diaphoresis, fatigue, fever and unexpected weight change.  Cardiovascular:        Breast mass and pain bilaterally  Skin:  Positive for color change and rash.  Neurological:  Negative for weakness.  Psychiatric/Behavioral:  Negative for confusion.   All other systems reviewed and are negative.       Objective:  BP (!) 132/92   Pulse (!) 102   Temp 98.1 F (36.7 C) (Temporal)   Ht 5\' 2"  (1.575 m)    Wt 173 lb 6.4 oz (78.7 kg)   LMP 04/11/2023   SpO2 100%   BMI 31.72 kg/m    Wt Readings from Last 3 Encounters:  04/25/23 173 lb 6.4 oz (78.7 kg)  04/17/23 172 lb 12.8 oz (78.4 kg)  03/28/23 171 lb 3.2 oz (77.7 kg)    Physical Exam Vitals and nursing note reviewed.  Constitutional:      General: She is not in acute distress.    Appearance: Normal appearance. She is not ill-appearing, toxic-appearing or diaphoretic.  HENT:     Head: Normocephalic and atraumatic.     Mouth/Throat:     Mouth: Mucous membranes are moist.     Pharynx: Oropharynx is clear.  Eyes:     Conjunctiva/sclera: Conjunctivae normal.     Pupils: Pupils are equal, round, and reactive to light.  Cardiovascular:     Rate and Rhythm: Normal rate and regular rhythm.     Heart sounds: Normal heart sounds.  Pulmonary:     Effort: Pulmonary effort is normal.     Breath sounds: Normal breath sounds.  Chest:     Chest wall: No mass or tenderness.  Breasts:    Breasts are symmetrical.     Right: Mass, nipple discharge, skin change and tenderness present. No swelling, bleeding or inverted nipple.     Left: Mass and tenderness present.    Musculoskeletal:     Right lower leg: No edema.     Left lower leg: No edema.  Lymphadenopathy:     Upper Body:     Right upper body: No supraclavicular, axillary or pectoral adenopathy.     Left upper body: No supraclavicular, axillary or pectoral adenopathy.  Skin:    General: Skin is warm and dry.     Capillary Refill: Capillary refill takes less than 2 seconds.     Findings: Erythema and rash present. Rash is papular.     Comments: Scattered rash to torso and upper arms  Neurological:     General: No focal deficit present.     Mental Status: She is alert and oriented to person, place, and time.  Psychiatric:        Mood and Affect: Mood normal.        Behavior: Behavior normal.        Thought Content: Thought content normal.        Judgment: Judgment normal.      Results for orders placed or performed in visit on 12/04/22  CBC with Differential/Platelet  Result Value Ref Range   WBC 5.9 3.4 - 10.8 x10E3/uL   RBC 5.04 3.77 - 5.28 x10E6/uL   Hemoglobin 13.2 11.1 - 15.9 g/dL   Hematocrit 96.0 45.4 - 46.6 %  MCV 81 79 - 97 fL   MCH 26.2 (L) 26.6 - 33.0 pg   MCHC 32.4 31.5 - 35.7 g/dL   RDW 16.1 09.6 - 04.5 %   Platelets 329 150 - 450 x10E3/uL   Neutrophils 52 Not Estab. %   Lymphs 37 Not Estab. %   Monocytes 8 Not Estab. %   Eos 2 Not Estab. %   Basos 1 Not Estab. %   Neutrophils Absolute 3.1 1.4 - 7.0 x10E3/uL   Lymphocytes Absolute 2.2 0.7 - 3.1 x10E3/uL   Monocytes Absolute 0.5 0.1 - 0.9 x10E3/uL   EOS (ABSOLUTE) 0.1 0.0 - 0.4 x10E3/uL   Basophils Absolute 0.0 0.0 - 0.2 x10E3/uL   Immature Granulocytes 0 Not Estab. %   Immature Grans (Abs) 0.0 0.0 - 0.1 x10E3/uL  CMP14+EGFR  Result Value Ref Range   Glucose 91 70 - 99 mg/dL   BUN 8 6 - 20 mg/dL   Creatinine, Ser 4.09 0.57 - 1.00 mg/dL   eGFR 87 >81 XB/JYN/8.29   BUN/Creatinine Ratio 9 9 - 23   Sodium 142 134 - 144 mmol/L   Potassium 4.7 3.5 - 5.2 mmol/L   Chloride 105 96 - 106 mmol/L   CO2 21 20 - 29 mmol/L   Calcium 9.6 8.7 - 10.2 mg/dL   Total Protein 6.7 6.0 - 8.5 g/dL   Albumin 4.5 3.9 - 4.9 g/dL   Globulin, Total 2.2 1.5 - 4.5 g/dL   Albumin/Globulin Ratio 2.0 1.2 - 2.2   Bilirubin Total 0.3 0.0 - 1.2 mg/dL   Alkaline Phosphatase 47 44 - 121 IU/L   AST 13 0 - 40 IU/L   ALT 7 0 - 32 IU/L  Lipase  Result Value Ref Range   Lipase 30 14 - 72 U/L  hCG, serum, qualitative  Result Value Ref Range   hCG,Beta Subunit,Qual,Serum Negative Negative <6 mIU/mL       Pertinent labs & imaging results that were available during my care of the patient were reviewed by me and considered in my medical decision making.  Assessment & Plan:  Wanona was seen today for breat knots.  Diagnoses and all orders for this visit:  Mass overlapping multiple quadrants of left  breast Mass overlapping multiple quadrants of right breast Family history of breast cancer in mother Will obtain imaging for further evaluation due to mother's history of breast cancer. Concerns for mastitis due to rash, erythema, and tenderness. Will treat with doxy.  -     US BREAST COMPLETE UNI RIGHT INC AXILLA -     US BREAST COMPLETE UNI LEFT INC AXILLA -     MM 3D DIAGNOSTIC MAMMOGRAM BILATERAL BREAST  Pruritic erythematous rash Ongoing. Will treat with oral steroids and doxycycline as prescribed. Symptomatic care discussed in detail. Aware to report new, worsening, or persistent symptoms.  -     predniSONE (DELTASONE) 20 MG tablet; Take 2 tablets (40 mg total) by mouth daily with breakfast for 5 days. -     doxycycline (VIBRA-TABS) 100 MG tablet; Take 1 tablet (100 mg total) by mouth 2 (two) times daily for 10 days. 1 po bid      Continue all other maintenance medications.  Follow up plan: Return if symptoms worsen or fail to improve.   Continue healthy lifestyle choices, including diet (rich in fruits, vegetables, and lean proteins, and low in salt and simple carbohydrates) and exercise (at least 30 minutes of moderate physical activity daily).   The above assessment and  management plan was discussed with the patient. The patient verbalized understanding of and has agreed to the management plan. Patient is aware to call the clinic if they develop any new symptoms or if symptoms persist or worsen. Patient is aware when to return to the clinic for a follow-up visit. Patient educated on when it is appropriate to go to the emergency department.   Kari Baars, FNP-C [facility] [phone]

## 2023-04-26 ENCOUNTER — Other Ambulatory Visit: Payer: Self-pay

## 2023-04-26 DIAGNOSIS — N6325 Unspecified lump in the left breast, overlapping quadrants: Secondary | ICD-10-CM

## 2023-04-26 DIAGNOSIS — N6315 Unspecified lump in the right breast, overlapping quadrants: Secondary | ICD-10-CM

## 2023-05-14 ENCOUNTER — Encounter: Payer: Self-pay | Admitting: Family Medicine

## 2023-05-21 ENCOUNTER — Encounter: Payer: Self-pay | Admitting: Family Medicine

## 2023-05-21 NOTE — Telephone Encounter (Signed)
Pt calling to make sure her mychart message is addressed. She sent one last week and no answer. Please message back on mychart

## 2023-05-21 NOTE — Telephone Encounter (Signed)
Let's work her in in the next 1-3 days.

## 2023-06-02 ENCOUNTER — Other Ambulatory Visit: Payer: Self-pay | Admitting: Family Medicine

## 2023-06-02 DIAGNOSIS — L2989 Other pruritus: Secondary | ICD-10-CM

## 2023-06-05 ENCOUNTER — Encounter (HOSPITAL_COMMUNITY): Payer: 59

## 2023-06-05 ENCOUNTER — Ambulatory Visit (HOSPITAL_COMMUNITY): Payer: 59

## 2023-06-25 ENCOUNTER — Encounter: Payer: Self-pay | Admitting: Family Medicine

## 2023-06-26 ENCOUNTER — Ambulatory Visit (HOSPITAL_COMMUNITY)
Admission: RE | Admit: 2023-06-26 | Discharge: 2023-06-26 | Disposition: A | Discharge status: Home / Self Care | Payer: 59 | Source: Ambulatory Visit | Attending: Family Medicine | Admitting: Family Medicine

## 2023-06-26 ENCOUNTER — Other Ambulatory Visit (HOSPITAL_COMMUNITY): Payer: Self-pay | Admitting: Family Medicine

## 2023-06-26 ENCOUNTER — Ambulatory Visit (HOSPITAL_COMMUNITY)
Admission: RE | Admit: 2023-06-26 | Discharge: 2023-06-26 | Disposition: A | Discharge status: Home / Self Care | Payer: 59 | Source: Ambulatory Visit | Attending: Family Medicine

## 2023-06-26 ENCOUNTER — Encounter (HOSPITAL_COMMUNITY): Payer: Self-pay

## 2023-06-26 DIAGNOSIS — N6315 Unspecified lump in the right breast, overlapping quadrants: Secondary | ICD-10-CM

## 2023-06-26 DIAGNOSIS — N6325 Unspecified lump in the left breast, overlapping quadrants: Secondary | ICD-10-CM | POA: Insufficient documentation

## 2023-06-26 DIAGNOSIS — R928 Other abnormal and inconclusive findings on diagnostic imaging of breast: Secondary | ICD-10-CM

## 2023-06-27 NOTE — Telephone Encounter (Signed)
The other factors are not considered, just diabetes. What other information is it that you are looking for?  Best Regards, Mechele Claude, M.D.

## 2023-06-28 NOTE — Progress Notes (Signed)
Patient has appt for 07/03/2023

## 2023-06-28 NOTE — Progress Notes (Signed)
 Appt scheduled for 07/03/2023

## 2023-07-02 ENCOUNTER — Encounter: Payer: Self-pay | Admitting: Family Medicine

## 2023-07-02 ENCOUNTER — Other Ambulatory Visit (HOSPITAL_COMMUNITY): Payer: Self-pay | Admitting: Family Medicine

## 2023-07-02 DIAGNOSIS — R928 Other abnormal and inconclusive findings on diagnostic imaging of breast: Secondary | ICD-10-CM

## 2023-07-03 ENCOUNTER — Inpatient Hospital Stay (HOSPITAL_COMMUNITY)
Admission: RE | Admit: 2023-07-03 | Discharge: 2023-07-03 | Discharge status: Home / Self Care | Payer: 59 | Source: Ambulatory Visit | Attending: Family Medicine

## 2023-07-03 ENCOUNTER — Ambulatory Visit (HOSPITAL_COMMUNITY)
Admission: RE | Admit: 2023-07-03 | Discharge: 2023-07-03 | Disposition: A | Discharge status: Home / Self Care | Payer: 59 | Source: Ambulatory Visit | Attending: Family Medicine | Admitting: Family Medicine

## 2023-07-03 ENCOUNTER — Encounter (HOSPITAL_COMMUNITY): Payer: Self-pay

## 2023-07-03 DIAGNOSIS — R590 Localized enlarged lymph nodes: Secondary | ICD-10-CM | POA: Insufficient documentation

## 2023-07-03 DIAGNOSIS — D241 Benign neoplasm of right breast: Secondary | ICD-10-CM | POA: Insufficient documentation

## 2023-07-03 DIAGNOSIS — R928 Other abnormal and inconclusive findings on diagnostic imaging of breast: Secondary | ICD-10-CM

## 2023-07-03 HISTORY — PX: BREAST BIOPSY: SHX20

## 2023-07-03 MED ORDER — LIDOCAINE-EPINEPHRINE (PF) 1 %-1:200000 IJ SOLN
INTRAMUSCULAR | Status: AC
  Filled 2023-07-03: qty 30

## 2023-07-03 MED ORDER — LIDOCAINE HCL (PF) 2 % IJ SOLN
10.0000 mL | Freq: Once | INTRAMUSCULAR | Status: AC
  Administered 2023-07-03: 10 mL

## 2023-07-03 MED ORDER — LIDOCAINE-EPINEPHRINE (PF) 1 %-1:200000 IJ SOLN
10.0000 mL | Freq: Once | INTRAMUSCULAR | Status: AC
  Administered 2023-07-03: 10 mL via INTRADERMAL

## 2023-07-03 MED ORDER — LIDOCAINE HCL (PF) 2 % IJ SOLN
INTRAMUSCULAR | Status: AC
  Filled 2023-07-03: qty 20

## 2023-07-03 NOTE — Progress Notes (Signed)
Patient tolerated two right breast biopsies today well and verbalized understanding of discharge instructions. Patient ambulatory to mammogram area at this time with no acute distress noted and give ice packs to use. Specimens taken to lab by Colette from ultrasound.

## 2023-07-04 LAB — SURGICAL PATHOLOGY

## 2023-09-27 ENCOUNTER — Encounter: Payer: Self-pay | Admitting: Family Medicine

## 2023-09-27 ENCOUNTER — Ambulatory Visit: Payer: 59 | Admitting: Family Medicine

## 2023-09-27 ENCOUNTER — Ambulatory Visit (INDEPENDENT_AMBULATORY_CARE_PROVIDER_SITE_OTHER): Payer: 59 | Admitting: Family Medicine

## 2023-09-27 VITALS — BP 125/89 | HR 99 | Temp 98.0°F | Ht 62.0 in | Wt 177.0 lb

## 2023-09-27 DIAGNOSIS — Z6833 Body mass index (BMI) 33.0-33.9, adult: Secondary | ICD-10-CM

## 2023-09-27 DIAGNOSIS — M2550 Pain in unspecified joint: Secondary | ICD-10-CM

## 2023-09-27 DIAGNOSIS — E66811 Obesity, class 1: Secondary | ICD-10-CM

## 2023-09-27 MED ORDER — PHENTERMINE HCL 37.5 MG PO CAPS: 37.5000 mg | ORAL_CAPSULE | ORAL | 5 refills | Status: AC

## 2023-09-27 NOTE — Progress Notes (Signed)
 Subjective:  Patient ID: Shannon Moon, female    DOB: 21-May-1986  Age: 38 y.o. MRN: [id number]  CC: Medical Management of Chronic Issues (No concerns at this time. )   HPI Shannon Moon presents for obesity treatment. Has pain in the legs, back intermittently.     03/28/2023    3:06 PM 12/26/2022    4:03 PM 12/04/2022    9:45 AM  Depression screen PHQ 2/9  Decreased Interest 0 0 0  Down, Depressed, Hopeless 0 0 0  PHQ - 2 Score 0 0 0    History Shannon Moon has a past medical history of Eczema and Pregnancy.   She has a past surgical history that includes Cesarean section; Tonsillectomy; Adenoidectomy; Breast biopsy (Right, 07/03/2023); Breast biopsy (Right, 07/03/2023); and Breast biopsy (Right, 07/03/2023).   Her family history includes Breast cancer (age of onset: 76) in her mother.She reports that she has never smoked. She has never used smokeless tobacco. She reports that she does not drink alcohol and does not use drugs.    ROS Review of Systems  Constitutional: Negative.   HENT: Negative.    Eyes:  Negative for visual disturbance.  Respiratory:  Negative for shortness of breath.   Cardiovascular:  Negative for chest pain.  Gastrointestinal:  Negative for abdominal pain.  Musculoskeletal:  Negative for arthralgias.    Objective:  BP 125/89   Pulse 99   Temp 98 F (36.7 C)   Ht 5\' 2"  (1.575 m)   Wt 177 lb (80.3 kg)   LMP 09/13/2023 (Approximate)   SpO2 98%   BMI 32.37 kg/m   BP Readings from Last 3 Encounters:  09/27/23 125/89  04/25/23 (!) 132/92  04/17/23 119/81    Wt Readings from Last 3 Encounters:  09/27/23 177 lb (80.3 kg)  04/25/23 173 lb 6.4 oz (78.7 kg)  04/17/23 172 lb 12.8 oz (78.4 kg)     Physical Exam Constitutional:      General: She is not in acute distress.    Appearance: She is well-developed.  Cardiovascular:     Rate and Rhythm: Normal rate and regular rhythm.  Pulmonary:     Breath sounds: Normal breath sounds.   Musculoskeletal:        General: Normal range of motion.  Skin:    General: Skin is warm and dry.  Neurological:     Mental Status: She is alert and oriented to person, place, and time.       Assessment & Plan:   Shannon Moon was seen today for medical management of chronic issues.  Diagnoses and all orders for this visit:  Class 1 obesity without serious comorbidity with body mass index (BMI) of 33.0 to 33.9 in adult, unspecified obesity type  Arthralgia, unspecified joint  Other orders -     phentermine 37.5 MG capsule; Take 1 capsule (37.5 mg total) by mouth every morning.       I am having Shannon Moon maintain her levonorgestrel-ethinyl estradiol and phentermine.  Allergies as of 09/27/2023   No Known Allergies      Medication List        Accurate as of September 27, 2023 11:59 PM. If you have any questions, ask your nurse or doctor.          levonorgestrel-ethinyl estradiol 0.15-30 MG-MCG tablet Commonly known as: NORDETTE Take 1 tablet by mouth daily.   phentermine 37.5 MG capsule Take 1 capsule (37.5 mg total) by mouth every morning.  Follow-up: No follow-ups on file.  Shannon Moon, M.D.

## 2023-09-28 ENCOUNTER — Telehealth (HOSPITAL_COMMUNITY): Payer: Self-pay | Admitting: Pharmacy Technician

## 2023-09-28 ENCOUNTER — Other Ambulatory Visit (HOSPITAL_COMMUNITY): Payer: Self-pay

## 2023-09-28 NOTE — Telephone Encounter (Signed)
Pharmacy Patient Advocate Encounter   Received notification from CoverMyMeds that prior authorization for PHENTERMINE 37.5MG  CAPSULES is required/requested.   Insurance verification completed.   The patient is insured through [facility] .   Per test claim: PA required; PA submitted to above mentioned insurance via CoverMyMeds Key/confirmation #/EOC  [id number] Status is pending

## 2023-10-01 NOTE — Telephone Encounter (Signed)
 Pharmacy Patient Advocate Encounter  Received notification from [facility] that Prior Authorization for PHENTERMINE 37.5MG  CAPSULES  has been DENIED.  Full denial letter will be uploaded to the media tab. See denial reason below.   PA #/Case ID/Reference #: [id number]

## 2023-10-01 NOTE — Telephone Encounter (Signed)
 Let her  know med isn't covered will have to pay out of pocket

## 2023-10-02 NOTE — Telephone Encounter (Signed)
 Pt informed    LS

## 2023-10-03 ENCOUNTER — Encounter: Payer: Self-pay | Admitting: Family Medicine

## 2024-02-25 ENCOUNTER — Ambulatory Visit: Payer: Self-pay

## 2024-02-25 NOTE — Telephone Encounter (Signed)
 FYI Only or Action Required?: FYI only for provider.  Patient was last seen in primary care on 09/27/2023 by Shannon Lowers, MD.  Called Nurse Triage reporting Back Pain.  Symptoms began a week ago.  Interventions attempted: OTC medications: Tylenol and Rest, hydration, or home remedies.  Symptoms are: unchanged.  Triage Disposition: See PCP When Office is Open (Within 3 Days)  Patient/caregiver understands and will follow disposition?: Yes  Copied from CRM [id number]. Topic: Clinical - Red Word Triage >> Feb 25, 2024  4:38 PM Graeme ORN wrote: Red Word that prompted transfer to Nurse Triage: Pain all over, back and neck , preventing movement. Reason for Disposition  [1] MODERATE back pain (e.g., interferes with normal activities) AND [2] present > 3 days  Answer Assessment - Initial Assessment Questions 1. ONSET: When did the pain begin? (e.g., minutes, hours, days)     Started a week ago 2. LOCATION: Where does it hurt? (upper, mid or lower back)     Lower back but goes up into left side of neck 3. SEVERITY: How bad is the pain?  (e.g., Scale 1-10; mild, moderate, or severe)     7-8 out of 10 4. PATTERN: Is the pain constant? (e.g., yes, no; constant, intermittent)      constant 5. RADIATION: Does the pain shoot into your legs or somewhere else?     no 6. CAUSE:  What do you think is causing the back pain?      unsure 7. BACK OVERUSE:  Any recent lifting of heavy objects, strenuous work or exercise?     no 8. MEDICINES: What have you taken so far for the pain? (e.g., nothing, acetaminophen, NSAIDS)     tylenol 9. NEUROLOGIC SYMPTOMS: Do you have any weakness, numbness, or problems with bowel/bladder control?     no 10. OTHER SYMPTOMS: Do you have any other symptoms? (e.g., fever, abdomen pain, burning with urination, blood in urine)       no 11. PREGNANCY: Is there any chance you are pregnant? When was your last menstrual period?       no  Protocols  used: Back Pain-A-AH

## 2024-02-26 ENCOUNTER — Encounter: Payer: Self-pay | Admitting: Nurse Practitioner

## 2024-02-26 ENCOUNTER — Ambulatory Visit (INDEPENDENT_AMBULATORY_CARE_PROVIDER_SITE_OTHER): Admitting: Nurse Practitioner

## 2024-02-26 VITALS — BP 136/96 | HR 101 | Temp 98.2°F | Ht 62.0 in | Wt 174.0 lb

## 2024-02-26 DIAGNOSIS — M549 Dorsalgia, unspecified: Secondary | ICD-10-CM | POA: Diagnosis not present

## 2024-02-26 MED ORDER — CYCLOBENZAPRINE HCL 10 MG PO TABS: 10.0000 mg | ORAL_TABLET | Freq: Three times a day (TID) | ORAL | 1 refills | Status: AC | PRN

## 2024-02-26 MED ORDER — PREDNISONE 20 MG PO TABS: 40.0000 mg | ORAL_TABLET | Freq: Every day | ORAL | 0 refills | Status: AC

## 2024-02-26 NOTE — Telephone Encounter (Signed)
 E2C2 scheduled appointment.

## 2024-02-26 NOTE — Progress Notes (Signed)
   Subjective:    Patient ID: Alfonso CINDERELLA Genera, female    DOB: 1985/10/04, 38 y.o.   MRN: [id number]   Chief Complaint: back pain Back Pain This is a new problem. The current episode started 1 to 4 weeks ago. The problem occurs intermittently. The problem has been waxing and waning since onset. The pain is present in the lumbar spine, thoracic spine and sacro-iliac. The quality of the pain is described as shooting. The pain does not radiate. The pain is at a severity of 7/10. The pain is moderate. The pain is Worse during the night. The symptoms are aggravated by lying down, standing and twisting. Pertinent negatives include no pelvic pain, perianal numbness, tingling or weakness. She has tried analgesics for the symptoms. The treatment provided mild relief.   Blood pressure is always elevated here but no one does anything about it.  She does not check her blood pressure at home. Denies chest  pain, sob ir headache.  BP Readings from Last 3 Encounters:  02/26/24 (!) 136/96  09/27/23 125/89  04/25/23 (!) 132/92    There are no active problems to display for this patient.      Review of Systems  Genitourinary:  Negative for pelvic pain.  Musculoskeletal:  Positive for back pain.  Neurological:  Negative for tingling and weakness.       Objective:   Physical Exam Cardiovascular:     Rate and Rhythm: Normal rate and regular rhythm.     Heart sounds: Normal heart sounds.  Pulmonary:     Breath sounds: Normal breath sounds.  Musculoskeletal:     Comments: Derease ROM of entire spine with thoracic pain on flexion and lumbar pain on rotation to right (-) SLFR bil Motor strength and sensation distally Intact  Skin:    General: Skin is warm.  Neurological:     General: No focal deficit present.     Mental Status: She is oriented to person, place, and time.     BP (!) 136/96   Pulse (!) 101   Temp 98.2 F (36.8 C) (Temporal)   Ht 5' 2 (1.575 m)   Wt 174 lb (78.9 kg)   BMI  31.83 kg/m        Assessment & Plan:   AERITH CANAL in today with chief complaint of Back Pain (All over. Hurts in back, neck, and shoulders/)   1. Acute midline back pain, unspecified back location (Primary) Moist heat Rest No heavy lifting RTO prn Sedation precautions with flexeril  - predniSONE  (DELTASONE ) 20 MG tablet; Take 2 tablets (40 mg total) by mouth daily with breakfast for 5 days. 2 po daily for 5 days  Dispense: 10 tablet; Refill: 0 - cyclobenzaprine  (FLEXERIL ) 10 MG tablet; Take 1 tablet (10 mg total) by mouth 3 (three) times daily as needed for muscle spasms.  Dispense: 30 tablet; Refill: 1    The above assessment and management plan was discussed with the patient. The patient verbalized understanding of and has agreed to the management plan. Patient is aware to call the clinic if symptoms persist or worsen. Patient is aware when to return to the clinic for a follow-up visit. Patient educated on when it is appropriate to go to the emergency department.   Mary-Margaret Gladis, FNP

## 2024-02-26 NOTE — Patient Instructions (Signed)
 Acute Back Pain, Adult Acute back pain is sudden and usually short-lived. It is often caused by an injury to the muscles and tissues in the back. The injury may result from: A muscle, tendon, or ligament getting overstretched or torn. Ligaments are tissues that connect bones to each other. Lifting something improperly can cause a back strain. Wear and tear (degeneration) of the spinal disks. Spinal disks are circular tissue that provide cushioning between the bones of the spine (vertebrae). Twisting motions, such as while playing sports or doing yard work. A hit to the back. Arthritis. You may have a physical exam, lab tests, and imaging tests to find the cause of your pain. Acute back pain usually goes away with rest and home care. Follow these instructions at home: Managing pain, stiffness, and swelling Take over-the-counter and prescription medicines only as told by your health care provider. Treatment may include medicines for pain and inflammation that are taken by mouth or applied to the skin, or muscle relaxants. Your health care provider may recommend applying ice during the first 24-48 hours after your pain starts. To do this: Put ice in a plastic bag. Place a towel between your skin and the bag. Leave the ice on for 20 minutes, 2-3 times a day. Remove the ice if your skin turns bright red. This is very important. If you cannot feel pain, heat, or cold, you have a greater risk of damage to the area. If directed, apply heat to the affected area as often as told by your health care provider. Use the heat source that your health care provider recommends, such as a moist heat pack or a heating pad. Place a towel between your skin and the heat source. Leave the heat on for 20-30 minutes. Remove the heat if your skin turns bright red. This is especially important if you are unable to feel pain, heat, or cold. You have a greater risk of getting burned. Activity  Do not stay in bed. Staying in  bed for more than 1-2 days can delay your recovery. Sit up and stand up straight. Avoid leaning forward when you sit or hunching over when you stand. If you work at a desk, sit close to it so you do not need to lean over. Keep your chin tucked in. Keep your neck drawn back, and keep your elbows bent at a 90-degree angle (right angle). Sit high and close to the steering wheel when you drive. Add lower back (lumbar) support to your car seat, if needed. Take short walks on even surfaces as soon as you are able. Try to increase the length of time you walk each day. Do not sit, drive, or stand in one place for more than 30 minutes at a time. Sitting or standing for long periods of time can put stress on your back. Do not drive or use heavy machinery while taking prescription pain medicine. Use proper lifting techniques. When you bend and lift, use positions that put less stress on your back: [place] your knees. Keep the load close to your body. Avoid twisting. Exercise regularly as told by your health care provider. Exercising helps your back heal faster and helps prevent back injuries by keeping muscles strong and flexible. Work with a physical therapist to make a safe exercise program, as recommended by your health care provider. Do any exercises as told by your physical therapist. Lifestyle Maintain a healthy weight. Extra weight puts stress on your back and makes it difficult to have good  posture. Avoid activities or situations that make you feel anxious or stressed. Stress and anxiety increase muscle tension and can make back pain worse. Learn ways to manage anxiety and stress, such as through exercise. General instructions Sleep on a firm mattress in a comfortable position. Try lying on your side with your knees slightly bent. If you lie on your back, put a pillow under your knees. Keep your head and neck in a straight line with your spine (neutral position) when using electronic equipment like  smartphones or pads. To do this: Raise your smartphone or pad to look at it instead of bending your head or neck to look down. Put the smartphone or pad at the level of your face while looking at the screen. Follow your treatment plan as told by your health care provider. This may include: Cognitive or behavioral therapy. Acupuncture or massage therapy. Meditation or yoga. Contact a health care provider if: You have pain that is not relieved with rest or medicine. You have increasing pain going down into your legs or buttocks. Your pain does not improve after 2 weeks. You have pain at night. You lose weight without trying. You have a fever or chills. You develop nausea or vomiting. You develop abdominal pain. Get help right away if: You develop new bowel or bladder control problems. You have unusual weakness or numbness in your arms or legs. You feel faint. These symptoms may represent a serious problem that is an emergency. Do not wait to see if the symptoms will go away. Get medical help right away. Call your local emergency services (911 in the [place] Do not drive yourself to the hospital. Summary Acute back pain is sudden and usually short-lived. Use proper lifting techniques. When you bend and lift, use positions that put less stress on your back. Take over-the-counter and prescription medicines only as told by your health care provider, and apply heat or ice as told. This information is not intended to replace advice given to you by your health care provider. Make sure you discuss any questions you have with your health care provider. Document Revised: 10/22/2020 Document Reviewed: 10/22/2020 Elsevier Patient Education  2024 [employer].

## 2024-03-26 ENCOUNTER — Ambulatory Visit: Payer: 59 | Admitting: Family Medicine

## 2024-08-26 ENCOUNTER — Ambulatory Visit: Payer: Self-pay

## 2024-08-26 ENCOUNTER — Encounter: Payer: Self-pay | Admitting: Family

## 2024-08-26 ENCOUNTER — Ambulatory Visit (INDEPENDENT_AMBULATORY_CARE_PROVIDER_SITE_OTHER): Admitting: Family

## 2024-08-26 VITALS — BP 144/87 | HR 97 | Temp 97.6°F | Ht 62.0 in | Wt 180.2 lb

## 2024-08-26 DIAGNOSIS — H5789 Other specified disorders of eye and adnexa: Secondary | ICD-10-CM | POA: Diagnosis not present

## 2024-08-26 DIAGNOSIS — J029 Acute pharyngitis, unspecified: Secondary | ICD-10-CM | POA: Diagnosis not present

## 2024-08-26 DIAGNOSIS — H109 Unspecified conjunctivitis: Secondary | ICD-10-CM | POA: Diagnosis not present

## 2024-08-26 DIAGNOSIS — R0981 Nasal congestion: Secondary | ICD-10-CM

## 2024-08-26 LAB — RAPID STREP SCREEN (MED CTR MEBANE ONLY): Strep Gp A Ag, IA W/Reflex: NEGATIVE

## 2024-08-26 LAB — VERITOR SARS-COV-2 AND FLU A+B
BD Veritor SARS-CoV-2 Ag: NEGATIVE
Influenza A: NEGATIVE
Influenza B: NEGATIVE

## 2024-08-26 LAB — CULTURE, GROUP A STREP

## 2024-08-26 MED ORDER — POLYMYXIN B-TRIMETHOPRIM 10000-0.1 UNIT/ML-% OP SOLN: 1.0000 [drp] | Freq: Four times a day (QID) | OPHTHALMIC | 0 refills | Status: AC

## 2024-08-26 MED ORDER — PREDNISONE 10 MG (21) PO TBPK: ORAL_TABLET | ORAL | 0 refills | Status: AC

## 2024-08-26 MED ORDER — AMOXICILLIN-POT CLAVULANATE 875-125 MG PO TABS: 1.0000 | ORAL_TABLET | Freq: Two times a day (BID) | ORAL | 0 refills | Status: AC

## 2024-08-26 NOTE — Patient Instructions (Addendum)
 Bacterial Conjunctivitis, Adult Bacterial conjunctivitis is an infection of the clear membrane that covers the white part of the eye and the inner surface of the eyelid (conjunctiva). When the blood vessels in the conjunctiva become inflamed, the eye becomes red or pink. The eye often feels irritated or itchy. Bacterial conjunctivitis spreads easily from person to person (is contagious). It also spreads easily from one eye to the other eye. What are the causes? This condition is caused by bacteria. You may get the infection if you come into close contact with: A person who is infected with the bacteria. Items that are contaminated with the bacteria, such as a face towel, contact lens solution, or eye makeup. What increases the risk? You are more likely to develop this condition if: You are exposed to other people who have the infection. You wear contact lenses. You have a sinus infection. You have had a recent eye injury or surgery. You have a weak body defense system (immune system). You have a medical condition that causes dry eyes. What are the signs or symptoms? Symptoms of this condition include: Thick, yellowish discharge from the eye. This may turn into a crust on the eyelid overnight and cause your eyelids to stick together. Tearing or watery eyes. Itchy eyes. Burning feeling in your eyes. Eye redness. Swollen eyelids. Blurred vision. How is this diagnosed? This condition is diagnosed based on your symptoms and medical history. Your health care provider may also take a sample of discharge from your eye to find the cause of your infection. How is this treated? This condition may be treated with: Antibiotic eye drops or ointment to clear the infection more quickly and prevent the spread of infection to others. Antibiotic medicines taken by mouth (orally) to treat infections that do not respond to drops or ointments or that last longer than 10 days. Cool, wet cloths (cool  compresses) placed on the eyes. Artificial tears applied 2-6 times a day. Follow these instructions at home: Medicines Take or apply your antibiotic medicine as told by your health care provider. Do not stop using the antibiotic, even if your condition improves, unless directed by your health care provider. Take or apply over-the-counter and prescription medicines only as told by your health care provider. Be very careful to avoid touching the edge of your eyelid with the eye-drop bottle or the ointment tube when you apply medicines to the affected eye. This will keep you from spreading the infection to your other eye or to other people. Managing discomfort Gently wipe away any drainage from your eye with a warm, wet washcloth or a cotton ball. Apply a clean, cool compress to your eye for 10-20 minutes, 3-4 times a day. General instructions Do not wear contact lenses until the inflammation is gone and your health care provider says it is safe to wear them again. Ask your health care provider how to sterilize or replace your contact lenses before you use them again. Wear glasses until you can resume wearing contact lenses. Avoid wearing eye makeup until the inflammation is gone. Throw away any old eye cosmetics that may be contaminated. Change or wash your pillowcase every day. Do not share towels or washcloths. This may spread the infection. Wash your hands often with soap and water for at least 20 seconds and especially before touching your face or eyes. Use paper towels to dry your hands. Avoid touching or rubbing your eyes. Do not drive or use heavy machinery if your vision is blurred. Contact  a health care provider if: You have a fever. Your symptoms do not get better after 10 days. Get help right away if: You have a fever and your symptoms suddenly get worse. You have severe pain when you move your eye. You have facial pain, redness, or swelling. You have a sudden loss of  vision. Summary Bacterial conjunctivitis is an infection of the clear membrane that covers the white part of the eye and the inner surface of the eyelid (conjunctiva). Bacterial conjunctivitis spreads easily from eye to eye and from person to person (is contagious). Wash your hands often with soap and water for at least 20 seconds and especially before touching your face or eyes. Use paper towels to dry your hands. Take or apply your antibiotic medicine as told by your health care provider. Do not stop using the antibiotic even if your condition improves. Contact a health care provider if you have a fever or if your symptoms do not get better after 10 days. Get help right away if you have a sudden loss of vision. This information is not intended to replace advice given to you by your health care provider. Make sure you discuss any questions you have with your health care provider. Document Revised: 11/10/2020 Document Reviewed: 11/10/2020 Elsevier Patient Education  2024 [employer].

## 2024-08-26 NOTE — Telephone Encounter (Signed)
 Appt made

## 2024-08-26 NOTE — Progress Notes (Signed)
 "  Subjective:    Patient ID: Shannon Moon, female    DOB: 09-26-1985, 39 y.o.   MRN: [id number]  Chief Complaint  Patient presents with   Sore Throat   swollen  neck and right eye     Itching and has hives on neck    PT presents to the office today neck swelling and rash that start Thursday. Reports this has happened twice.   Reports congestion, eye swelling, and ear pain that started yesterday.  Sore Throat  This is a new problem. The current episode started yesterday. The problem has been gradually worsening. The pain is worse on the right side. The pain is at a severity of 8/10. The pain is moderate. Associated symptoms include congestion, coughing, ear pain, headaches, a hoarse voice, neck pain, swollen glands and trouble swallowing. Pertinent negatives include no shortness of breath. She has tried acetaminophen for the symptoms.  Eye Problem  The right eye is affected. This is a new problem. The current episode started yesterday. The problem occurs constantly. The problem has been gradually worsening. The pain is at a severity of 9/10. The pain is moderate. Associated symptoms include blurred vision, an eye discharge, eye redness, a foreign body sensation, itching, photophobia and a recent URI. She has tried eye drops for the symptoms. The treatment provided mild relief.      Review of Systems  HENT:  Positive for congestion, ear pain, hoarse voice and trouble swallowing.   Eyes:  Positive for blurred vision, photophobia, discharge, redness and itching.  Respiratory:  Positive for cough. Negative for shortness of breath.   Musculoskeletal:  Positive for neck pain.  Neurological:  Positive for headaches.  All other systems reviewed and are negative.   Social History   Socioeconomic History   Marital status: Married    Spouse name: Not on file   Number of children: Not on file   Years of education: Not on file   Highest education level: Not on file  Occupational History    Not on file  Tobacco Use   Smoking status: Never   Smokeless tobacco: Never  Vaping Use   Vaping status: Never Used  Substance and Sexual Activity   Alcohol use: No   Drug use: No   Sexual activity: Yes    Birth control/protection: None  Other Topics Concern   Not on file  Social History Narrative   Not on file   Social Drivers of Health   Tobacco Use: Low Risk (08/26/2024)   Patient History    Smoking Tobacco Use: Never    Smokeless Tobacco Use: Never    Passive Exposure: Not on file  Financial Resource Strain: Low Risk (07/10/2024)   Received from [facility]   Overall Financial Resource Strain (CARDIA)    How hard is it for you to pay for the very basics like food, housing, medical care, and heating?: Not hard at all  Food Insecurity: No Food Insecurity (07/10/2024)   Received from [facility]   Epic    Within the past 12 months, you worried that your food would run out before you got the money to buy more.: Never true    Within the past 12 months, the food you bought just didn't last and you didn't have money to get more.: Never true  Transportation Needs: No Transportation Needs (07/10/2024)   Received from [facility]    In the past 12 months, has lack of transportation kept you  from medical appointments or from getting medications?: No    In the past 12 months, has lack of transportation kept you from meetings, work, or from getting things needed for daily living?: No  Physical Activity: Inactive (07/10/2024)   Received from [facility]   Exercise Vital Sign    On average, how many days per week do you engage in moderate to strenuous exercise (like a brisk walk)?: 0 days    Minutes of Exercise per Session: Not on file  Stress: No Stress Concern Present (07/10/2024)   Received from [facility] - Occupational Stress Questionnaire    Do you feel stress - tense, restless, nervous, or anxious, or unable to sleep  at night because your mind is troubled all the time - these days?: Not at all  Social Connections: Moderately Integrated (07/10/2024)   Received from [facility]   Social Network    How would you rate your social network (family, work, friends)?: Adequate participation with social networks  Depression (PHQ2-9): Low Risk (02/26/2024)   Depression (PHQ2-9)    PHQ-2 Score: 0  Alcohol Screen: Not on file  Housing: Low Risk (07/10/2024)   Received from [facility]    In the last 12 months, was there a time when you were not able to pay the mortgage or rent on time?: No    In the past 12 months, how many times have you moved where you were living?: 0    At any time in the past 12 months, were you homeless or living in a shelter (including now)?: No  Utilities: Not At Risk (07/10/2024)   Received from [facility]    In the past 12 months has the electric, gas, oil, or water company threatened to shut off services in your home?: No  Health Literacy: Low Risk (05/26/2024)   Received from [facility]   Health Literacy    : Never   Family History  Problem Relation Age of Onset   Breast cancer Mother 52        Objective:   Physical Exam Vitals reviewed.  Constitutional:      General: She is not in acute distress.    Appearance: She is well-developed.  HENT:     Head: Normocephalic and atraumatic.     Right Ear: A middle ear effusion is present. Tympanic membrane is erythematous.     Left Ear: Tympanic membrane normal.  Eyes:     Conjunctiva/sclera:     Right eye: Exudate present.     Pupils: Pupils are equal, round, and reactive to light.     Comments: Right eye lid swollen, white discharge  Neck:     Thyroid : No thyromegaly.      Comments: Swollen tender lymph node on right Cardiovascular:     Rate and Rhythm: Normal rate and regular rhythm.     Heart sounds: Normal heart sounds. No murmur heard. Pulmonary:     Effort: Pulmonary effort is normal. No  respiratory distress.     Breath sounds: Normal breath sounds. No wheezing.  Abdominal:     General: Bowel sounds are normal. There is no distension.     Palpations: Abdomen is soft.     Tenderness: There is no abdominal tenderness.  Musculoskeletal:        General: No tenderness. Normal range of motion.     Cervical back: Normal range of motion and neck supple.  Lymphadenopathy:     Cervical: Cervical adenopathy present.     Right cervical: Superficial cervical adenopathy and posterior cervical adenopathy present.  Skin:    General: Skin is warm and dry.  Neurological:     Mental Status: She is alert and oriented to person, place, and time.     Cranial Nerves: No cranial nerve deficit.     Deep Tendon Reflexes: Reflexes are normal and symmetric.  Psychiatric:        Behavior: Behavior normal.        Thought Content: Thought content normal.        Judgment: Judgment normal.       BP (!) 144/87   Pulse 97   Temp 97.6 F (36.4 C) (Temporal)   Ht 5' 2 (1.575 m)   Wt 180 lb 3.2 oz (81.7 kg)   SpO2 100%   BMI 32.96 kg/m      Assessment & Plan:  Shannon Moon comes in today with chief complaint of Sore Throat and swollen  neck and right eye  (Itching and has hives on neck )   Diagnosis and orders addressed:  1. Sinus congestion (Primary) - Rapid Strep Screen ([facility] ONLY) - Veritor SARS-CoV-2 and Flu A+B  2. Eye swelling  3. Sore throat - Rapid Strep Screen ([facility] ONLY) - Veritor SARS-CoV-2 and Flu A+B  4. Bacterial conjunctivitis Start polytrim   Good hand hygiene  Avoid rubbing eye Warm compresses  Follow up if symptoms worsen or do not improve  - trimethoprim -polymyxin b  (POLYTRIM ) ophthalmic solution; Place 1 drop into the left eye every 6 (six) hours.  Dispense: 10 mL; Refill: 0  5. Acute pharyngitis, unspecified etiology - Take meds as prescribed - Use a cool mist humidifier  -Use saline nose sprays frequently -Force fluids -For  any cough or congestion  Use plain Mucinex - regular strength or max strength is fine -For fever or aces or pains- take tylenol or ibuprofen . -Throat lozenges if help -New toothbrush in 3 days - amoxicillin -clavulanate (AUGMENTIN ) 875-125 MG tablet; Take 1 tablet by mouth 2 (two) times daily.  Dispense: 14 tablet; Refill: 0 - predniSONE  (STERAPRED UNI-PAK 21 TAB) 10 MG (21) TBPK tablet; Use as directed  Dispense: 21 tablet; Refill: 0 Follow up if symptoms worsen or do not improve     Bari Learn, FNP   "

## 2024-08-26 NOTE — Telephone Encounter (Signed)
" °  FYI Only or Action Required?: Action required by provider: request for appointment.  Patient was last seen in primary care on 02/26/2024 by Gladis Mustard, FNP.  Called Nurse Triage reporting Sore Throat.  Symptoms began yesterday.  Interventions attempted: Rest, hydration, or home remedies.  Symptoms are: gradually worsening. Severe sore throat, hurts to swallow. Has right ear pain, right eye is swollen. No fever.  Triage Disposition: See Physician Within 24 Hours  Patient/caregiver understands and will follow disposition?: Yes      Copied from CRM #[id number]. Topic: Clinical - Red Word Triage >> Aug 26, 2024  8:29 AM Shannon Moon wrote: Red Word that prompted transfer to Nurse Triage: PT has Severe Sore Throat/RT Ear hurts/RT eye swollen/Hard to swollow/ for 2 days. Getting worse/Hard sleep/Hard to work Reason for Disposition  Earache also present  Answer Assessment - Initial Assessment Questions 1. ONSET: When did the throat start hurting? (Hours or days ago)      yesterday 2. SEVERITY: How bad is the sore throat? (Scale 1-10; mild, moderate or severe)     severe 3. STREP EXPOSURE: Has there been any exposure to strep within the past week? If Yes, ask: What type of contact occurred?      no 4.  VIRAL SYMPTOMS: Are there any symptoms of a cold, such as a runny nose, cough, hoarse voice or red eyes?      Right ear pain, right eye swollen 5. FEVER: Do you have a fever? If Yes, ask: What is your temperature, how was it measured, and when did it start?     no 6. PUS ON THE TONSILS: Is there pus on the tonsils in the back of your throat?     no 7. OTHER SYMPTOMS: Do you have any other symptoms? (e.g., difficulty breathing, headache, rash)     headache 8. PREGNANCY: Is there any chance you are pregnant? When was your last menstrual period?     no  Protocols used: Sore Throat-A-AH  "

## 2024-08-28 ENCOUNTER — Ambulatory Visit: Payer: Self-pay | Admitting: Family
